# Patient Record
Sex: Male | Born: 1955
Health system: Southern US, Community
[De-identification: ages and names within clinical notes are randomized; demographics above are authoritative.]

## PROBLEM LIST (undated history)

## (undated) DIAGNOSIS — F988 Other specified behavioral and emotional disorders with onset usually occurring in childhood and adolescence: Secondary | ICD-10-CM

## (undated) DIAGNOSIS — M216X1 Other acquired deformities of right foot: Secondary | ICD-10-CM

## (undated) DIAGNOSIS — R339 Retention of urine, unspecified: Secondary | ICD-10-CM

## (undated) DIAGNOSIS — E119 Type 2 diabetes mellitus without complications: Secondary | ICD-10-CM

## (undated) DIAGNOSIS — M47812 Spondylosis without myelopathy or radiculopathy, cervical region: Secondary | ICD-10-CM

## (undated) DIAGNOSIS — I10 Essential (primary) hypertension: Secondary | ICD-10-CM

## (undated) DIAGNOSIS — Z87898 Personal history of other specified conditions: Secondary | ICD-10-CM

## (undated) DIAGNOSIS — R399 Unspecified symptoms and signs involving the genitourinary system: Secondary | ICD-10-CM

## (undated) DIAGNOSIS — Z972 Presence of dental prosthetic device (complete) (partial): Secondary | ICD-10-CM

## (undated) DIAGNOSIS — Z9989 Dependence on other enabling machines and devices: Secondary | ICD-10-CM

## (undated) DIAGNOSIS — M1990 Unspecified osteoarthritis, unspecified site: Secondary | ICD-10-CM

## (undated) DIAGNOSIS — G4733 Obstructive sleep apnea (adult) (pediatric): Secondary | ICD-10-CM

## (undated) DIAGNOSIS — Z973 Presence of spectacles and contact lenses: Secondary | ICD-10-CM

## (undated) DIAGNOSIS — M216X2 Other acquired deformities of left foot: Secondary | ICD-10-CM

## (undated) DIAGNOSIS — N138 Other obstructive and reflux uropathy: Secondary | ICD-10-CM

## (undated) DIAGNOSIS — T4145XA Adverse effect of unspecified anesthetic, initial encounter: Secondary | ICD-10-CM

## (undated) DIAGNOSIS — T8859XA Other complications of anesthesia, initial encounter: Secondary | ICD-10-CM

## (undated) DIAGNOSIS — Z974 Presence of external hearing-aid: Secondary | ICD-10-CM

## (undated) DIAGNOSIS — K08109 Complete loss of teeth, unspecified cause, unspecified class: Secondary | ICD-10-CM

## (undated) DIAGNOSIS — N401 Enlarged prostate with lower urinary tract symptoms: Secondary | ICD-10-CM

## (undated) HISTORY — PX: ANTERIOR CERVICAL DECOMP/DISCECTOMY FUSION: SHX1161

## (undated) HISTORY — PX: DIRECT LARYNGOSCOPY: SHX5326

---

## 2005-11-13 ENCOUNTER — Encounter (INDEPENDENT_AMBULATORY_CARE_PROVIDER_SITE_OTHER): Payer: Self-pay | Admitting: *Deleted

## 2005-11-13 ENCOUNTER — Ambulatory Visit (HOSPITAL_COMMUNITY): Admission: RE | Admit: 2005-11-13 | Discharge: 2005-11-13 | Payer: Self-pay | Admitting: Otolaryngology

## 2006-10-21 ENCOUNTER — Ambulatory Visit: Payer: Self-pay | Admitting: Unknown Physician Specialty

## 2008-03-16 DIAGNOSIS — G4730 Sleep apnea, unspecified: Secondary | ICD-10-CM

## 2008-03-16 HISTORY — DX: Sleep apnea, unspecified: G47.30

## 2008-11-13 ENCOUNTER — Ambulatory Visit: Payer: Self-pay | Admitting: Unknown Physician Specialty

## 2010-01-08 ENCOUNTER — Encounter: Admission: RE | Admit: 2010-01-08 | Discharge: 2010-01-08 | Payer: Self-pay | Admitting: Family Medicine

## 2010-06-23 ENCOUNTER — Encounter (HOSPITAL_COMMUNITY)
Admission: RE | Admit: 2010-06-23 | Discharge: 2010-06-23 | Disposition: A | Discharge status: Home / Self Care | Payer: BC Managed Care – PPO | Source: Ambulatory Visit | Attending: Neurological Surgery | Admitting: Neurological Surgery

## 2010-06-23 ENCOUNTER — Other Ambulatory Visit (HOSPITAL_COMMUNITY): Payer: Self-pay | Admitting: Neurological Surgery

## 2010-06-23 DIAGNOSIS — M4802 Spinal stenosis, cervical region: Secondary | ICD-10-CM

## 2010-06-23 LAB — DIFFERENTIAL
Basophils Relative: 1 % (ref 0–1)
Lymphocytes Relative: 35 % (ref 12–46)
Lymphs Abs: 2.8 10*3/uL (ref 0.7–4.0)
Monocytes Relative: 6 % (ref 3–12)
Neutro Abs: 4.5 10*3/uL (ref 1.7–7.7)
Neutrophils Relative %: 57 % (ref 43–77)

## 2010-06-23 LAB — BASIC METABOLIC PANEL
CO2: 30 mEq/L (ref 19–32)
Chloride: 108 mEq/L (ref 96–112)
Creatinine, Ser: 1.34 mg/dL (ref 0.4–1.5)
GFR calc Af Amer: >60 mL/min (ref >60)

## 2010-06-23 LAB — PROTIME-INR: Prothrombin Time: 12.4 seconds (ref 11.6–15.2)

## 2010-06-23 LAB — CBC
Hemoglobin: 12.9 g/dL — ABNORMAL LOW (ref 13.0–17.0)
MCH: 32.6 pg (ref 26.0–34.0)
RBC: 3.96 MIL/uL — ABNORMAL LOW (ref 4.22–5.81)

## 2010-06-23 LAB — SURGICAL PCR SCREEN: MRSA, PCR: NEGATIVE

## 2010-06-27 ENCOUNTER — Ambulatory Visit (HOSPITAL_COMMUNITY)
Admission: RE | Admit: 2010-06-27 | Discharge: 2010-06-29 | Disposition: A | Discharge status: Home / Self Care | Payer: BC Managed Care – PPO | Source: Ambulatory Visit | Attending: Neurological Surgery | Admitting: Neurological Surgery

## 2010-06-27 ENCOUNTER — Inpatient Hospital Stay (HOSPITAL_COMMUNITY): Payer: BC Managed Care – PPO

## 2010-06-27 DIAGNOSIS — E669 Obesity, unspecified: Secondary | ICD-10-CM | POA: Insufficient documentation

## 2010-06-27 DIAGNOSIS — Z01810 Encounter for preprocedural cardiovascular examination: Secondary | ICD-10-CM | POA: Insufficient documentation

## 2010-06-27 DIAGNOSIS — Z87891 Personal history of nicotine dependence: Secondary | ICD-10-CM | POA: Insufficient documentation

## 2010-06-27 DIAGNOSIS — M4802 Spinal stenosis, cervical region: Secondary | ICD-10-CM | POA: Insufficient documentation

## 2010-06-27 DIAGNOSIS — G4733 Obstructive sleep apnea (adult) (pediatric): Secondary | ICD-10-CM | POA: Insufficient documentation

## 2010-06-27 DIAGNOSIS — I10 Essential (primary) hypertension: Secondary | ICD-10-CM | POA: Insufficient documentation

## 2010-06-27 DIAGNOSIS — J449 Chronic obstructive pulmonary disease, unspecified: Secondary | ICD-10-CM | POA: Insufficient documentation

## 2010-06-27 DIAGNOSIS — K219 Gastro-esophageal reflux disease without esophagitis: Secondary | ICD-10-CM | POA: Insufficient documentation

## 2010-06-27 DIAGNOSIS — Z01818 Encounter for other preprocedural examination: Secondary | ICD-10-CM | POA: Insufficient documentation

## 2010-06-27 DIAGNOSIS — Z01812 Encounter for preprocedural laboratory examination: Secondary | ICD-10-CM | POA: Insufficient documentation

## 2010-06-27 DIAGNOSIS — J4489 Other specified chronic obstructive pulmonary disease: Secondary | ICD-10-CM | POA: Insufficient documentation

## 2010-06-27 DIAGNOSIS — E119 Type 2 diabetes mellitus without complications: Secondary | ICD-10-CM | POA: Insufficient documentation

## 2010-06-27 DIAGNOSIS — G959 Disease of spinal cord, unspecified: Secondary | ICD-10-CM | POA: Insufficient documentation

## 2010-06-27 LAB — GLUCOSE, CAPILLARY
Glucose-Capillary: 137 mg/dL — ABNORMAL HIGH (ref 70–99)
Glucose-Capillary: 222 mg/dL — ABNORMAL HIGH (ref 70–99)

## 2010-06-28 LAB — GLUCOSE, CAPILLARY
Glucose-Capillary: 206 mg/dL — ABNORMAL HIGH (ref 70–99)
Glucose-Capillary: 155 mg/dL — ABNORMAL HIGH (ref 70–99)

## 2010-06-29 LAB — GLUCOSE, CAPILLARY: Glucose-Capillary: 165 mg/dL — ABNORMAL HIGH (ref 70–99)

## 2010-06-30 LAB — GLUCOSE, CAPILLARY: Glucose-Capillary: 166 mg/dL — ABNORMAL HIGH (ref 70–99)

## 2010-06-30 NOTE — Op Note (Signed)
NAME:  Colton Hampton, Colton Hampton NO.:  [id number]  MEDICAL RECORD NO.:  [id number]           PATIENT TYPE:  I  LOCATION:  3528                         FACILITY:  [facility]  PHYSICIAN:  Tia Alert, MD     DATE OF BIRTH:  03-Mar-1956  DATE OF PROCEDURE:  06/27/2010 DATE OF DISCHARGE:                              OPERATIVE REPORT   PREOPERATIVE DIAGNOSES: 1. Cervical spinal stenosis at C3-4 and C6-7 with cord compression. 2. Neck and left arm pain.  POSTOPERATIVE DIAGNOSES: 1. Cervical spinal stenosis at C3-4 and C6-7 with cord compression. 2. Neck and left arm pain. 3. 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  PROCEDURES: 1. Decompressive anterior cervical diskectomy at C3-4 and C6-7. 2. Anterior cervical arthrodesis at C3-4 and C6-7 utilizing PEEK     interbody cages, packed with local autograft and Actifuse putty. 3. Anterior cervical plating at C3-4 and C6-7 utilizing the DePuy     spine plate.  SURGEON:  Tia Alert, MD  ASSISTANT:  Reinaldo Meeker, MD  ANESTHESIA:  General endotracheal.  COMPLICATIONS:  None apparent.  INDICATIONS FOR PROCEDURE:  [name] is a 55 year old gentleman who presented with severe neck pain with radiation into the left arm.  He had an MRI which showed severe spinal stenosis at C3-4 and moderate spinal stenosis at C6-7 with foraminal stenosis at C6-7.  He had neck and left arm pain.  He tried medical management for quite some time without significant relief.  I recommended 2-level ACDF plating at C3-4, and C6-7 through separate incisions.  He understood the risks, benefits, and affected outcomes and wished to proceed.  DESCRIPTION OF PROCEDURE:  The patient was taken to the operating room and after induction of adequate generalized endotracheal anesthesia he was placed in supine position on the operating table.  His right anterior cervical region was prepped with  DuraPrep and draped in the usual sterile fashion.  I localized onto incisions with lateral fluoroscopy.  I started at C3-4 and made a transverse incision and carried this down to the platysma which was elevated, opened, and undermined with Metzenbaum scissors.  I then dissected a plane medial to the sternocleidomastoid muscle and internal carotid artery and lateral to the trachea and esophagus to expose C3-4.  Intraoperative fluoroscopy confirmed my level and the longus colli muscle was taken down and the shadow line retractor was placed under this to expose C3-4.  The annulus was incised and initial diskectomy was done with pituitary rongeurs and curved curettes.  I then used the high-speed drill to drill the endplates down to the level of the posterior longitudinal ligament and posterior spurs.  I save the drill shavings in a mucus trap for later arthrodesis.  I widened the disk space in a rectangular fashion in order to decompress the canal adequately.  I was able to find an ossified ligament at C3 forward with significant compression of the dura.  There were actually 2 components, so this one might be more calcified disk and one more calcified ligament.  I took these off separately.  I was able  to use a nerve hook and gently dissect between the dura and the ossified ligament and then removed it with 1 and 2 mm Kerrison punch pulling up at all times in order to not push down against the cord itself.  I was able to undercut C3 and C4.  Dr. Gerlene Fee helped dissect between the ligament and the osteophyte.  I was able to remove this almost totally on the patient's left-hand side.  We left a very thin shell, but this was released and was no longer compressive of the dura, but there appeared to be thin dura underneath this and we did not want to risk CSF leak when it was no longer causing any compression, so we left a thin shell of ossified ligament on the left-hand side of the canal,  probably in the lateral 25% of the canal.  This was not compressed.  The dura was now full and capacious.  I could pass the nerve hook easily.  I felt like I had an adequate decompression of C3-4.  I measured the interspace to be 8 mm and I used an 8 mm PEEK interbody cage, packed this with local autograft and Actifuse putty and tapped this into position at C3- 4.  I then used a size 14 plate at this level with 14-mm variable angle screws in the bodies of C3 and C4 and then locked these into position. I then irrigated with saline solution, dried all bleeding points, and moved to C6-7 where again we made a transverse incision, carried this down to the platysma which was elevated, opened, and undermined.  I then did the dissection as we did at C3-4 to expose C6-7.  Intraoperative fluoroscopy confirmed my level and then longus colli muscles were taken down and the shadow line retractor once again placed.  The annulus was incised.  The diskectomy was done with pituitary rongeurs and curved curettes.  I then used the high-speed drill once again saving the drill shavings in a mucus trap.  I was able to bring in the operating microscope and opened the posterior longitudinal ligament.  It was not ossified at this level.  I was able to undercut the bodies of C6 and C7 and performed generous foraminotomies on the left more than the right. Since he had left arm pain, I identified the C7 nerve root and marched distally into the foramen until I was distal to the pedicle and had it well decompressed.  I could then pass a nerve hook easily circumferentially especially along the C7 nerve root on the left.  I then irrigated, dried the surgical bed, measured the interspace to be 8 mm.  Once again, I used an 8-mm PEEK interbody cage, packed with local autograft and Actifuse putty, and tapped this into position at C6-7.  I then used a size 14 plate once again and placed 14-mm variable angle screws in bodies  of C6 and C7 and then locked these once again.  I then irrigated it with saline solution containing bacitracin, dried all bleeding points with Surgifoam and bipolar cautery.  Since I had two separate incisions and one was at C3-4, I wanted to place a #7 flat JP drain and this was placed through the C6-7 incision, but extended up to the C3-4 incision to drain both.  I did achieve good hemostasis at both levels and therefore I was able to closed the platysma at both levels with 3-0 Vicryl, the subcuticular tissue with 3-0 Vicryl, and skin with benzoin and Steri-Strips.  The drapes were removed.  Sterile dressing was applied.  The patient was awakened from anesthesia and transferred to the recovery room in stable condition.  At the end of the procedure, all sponge, needle, and instrument counts were correct.     Tia Alert, MD     DSJ/MEDQ  D:  06/27/2010  T:  06/28/2010  Job:  [id number]  Electronically Signed by Marikay Alar MD on 06/29/2010 12:58:45 PM

## 2010-07-14 NOTE — Discharge Summary (Signed)
  NAME:  Colton Hampton, Colton Hampton NO.:  [id number]  MEDICAL RECORD NO.:  [id number]           PATIENT TYPE:  I  LOCATION:  3031                         FACILITY:  [facility]  PHYSICIAN:  Tia Alert, MD     DATE OF BIRTH:  June 07, 1955  DATE OF ADMISSION:  06/27/2010 DATE OF DISCHARGE:  06/29/2010                              DISCHARGE SUMMARY   ADMITTING DIAGNOSIS:  Cervical spondylosis with stenosis.  PROCEDURES:  Anterior cervical diskectomy and fusion with plating at C3- 4 and C6-7.  BRIEF HISTORY OF PRESENT ILLNESS:  Mr. Gentles is a very pleasant 55- year-old gentleman who presented with neck and left arm pain.  He had an MRI which showed severe spinal stenosis at C3-4 and C6-7, recommended two-level ACDF with plating at C3-4 and C6-7.  He understood the risks, benefits, and expected outcome and wished to proceed.  HOSPITAL COURSE:  The patient was admitted on June 27, 2010, taken to the operating room where he underwent a two-level ACDF with plating at C3-4 and C6-7.  The patient did very well following the surgery he had improvement in his left arm pain.  However, he did have urinary retention and this kept him in the hospital for couple of days.  We put him on some Flomax.  We put a catheter in for 24 hours, when it was removed he was able to urinate, had low postvoid residuals, and was discharged to home in stable condition on June 29, 2010. Plans to follow up in 2 weeks, he is going to follow up with his urologist 1 week after discharge.  He was given Flomax upon discharge.  He had normal postoperative neck soreness after surgery, no radiculopathy, had good strength, he was ambulating normally and tolerating regular diet. Discharged to home in stable condition on June 29, 2010.  FINAL DIAGNOSIS:  Anterior cervical diskectomy and fusion with plating C3-4 and C6-7.     Tia Alert, MD     DSJ/MEDQ  D:  06/29/2010  T:  06/29/2010  Job:   [id number]  Electronically Signed by Marikay Alar MD on 07/14/2010 10:46:08 AM

## 2010-08-01 NOTE — Op Note (Signed)
NAME:  KHARTER, BREW NO.:  [id number]   MEDICAL RECORD NO.:  [id number]          PATIENT TYPE:  AMB   LOCATION:  SDS                          FACILITY:  [facility]   PHYSICIAN:  Antony Contras, MD     DATE OF BIRTH:  Nov 30, 1955   DATE OF PROCEDURE:  11/13/2005  DATE OF DISCHARGE:                                 OPERATIVE REPORT   PREOPERATIVE DIAGNOSIS:  1. Right vocal cord lesion.  2. Hoarseness.   POSTOPERATIVE DIAGNOSIS:  1. Right vocal cord lesion.  2. Hoarseness.   PROCEDURE:  1. Suspended micro direct laryngoscopy with right vocal cord stripping for      biopsy.  2. Rigid esophagoscopy.  3. Rigid bronchoscopy.   SURGEON:  Excell Seltzer. Jenne Pane M.D.   ANESTHESIA:  General endotracheal anesthesia.   COMPLICATIONS:  None.   INDICATIONS:  The patient is a 55 year old white male who has had a scratchy  voice for a long time.  He smokes cigarettes and drinks a fair amount of  alcohol.  He was found on examination to have a white lesion of the right  vocal fold and presents to the operating room for biopsy.   FINDINGS:  The right mid vocal fold mucosa is thickened and whitened  consistent with leukoplakia.  There is no gross evidence of invasive disease  of any kind.  A portion of the right tongue base bled during evaluation with  the laryngoscope and so a biopsy was performed, as well, from that area with  the cup forceps.  The bronchoscopy and esophagoscopy showed no abnormality.   DESCRIPTION OF PROCEDURE:  The patient was identified in the holding room,  informed consent having been obtained, the patient was placed on the  operating table in a supine position.  Anesthesia was induced.  The patient  was maintained via mask ventilation.  The bed was turned 90 degrees from  anesthesia and the eyes taped closed.  A damp gauze was placed over the  upper gum.  The larynx was then exposed using a #3 Miller laryngoscope and a  large 0 degree telescope was then  passed through the larynx and into the  trachea to view the trachea and primary bronchi.  There was mucus seen in  the distal mainstem bronchus obstructing the view of the secondary bronchi.  The right side appeared normal.  The telescope was then backed out and a 6  laser safe tube was then placed to the larynx without difficulty.  The  laryngoscope was removed and the tube was positioned on the left side.  The  cuff was inflated with saline and the anesthesia circuit hooked up and the  patient was successfully ventilated.  The tube was taped to the left side.  At this point, a #8 rigid esophagoscope was passed through the mouth to view  the esophagus keeping the lumen in view during placement.  There was  carefully backed out evaluating esophagus 360 degrees coming out.  It was  then removed.  At this point, the pharynx and larynx were examined with the  Dedo laryngoscope evaluating the vallecula, piriform  sinuses, postcricoid  region, and endolarynx.  An area of the tongue base bled during evaluation  and a biopsy was performed in this area.  The endolarynx was then exposed  using the Dedo laryngoscope and was then placed in suspension using the  Louie arm on the Mayo stand.  The microscope was brought into view after  pictures were taken with the 0 degrees telescope.  The larynx was then  carefully evaluated.  The left vocal fold was found to be edematous and  erythematous.  The right vocal fold had the findings noted above.  An  epinephrine soaked pledget was held against the right vocal fold for a  couple of minutes.  This was then removed.  An incision was then made in the  mucosa lateral to the lesion using a sickle knife.  The mucosa was then  grasped with cup forceps and scissors were then used to carefully remove the  mucosa.  It was removed in a piecemeal fashion.  After this was completed,  the vocal fold was again rubbed with an epinephrine soaked pledget.  The  larynx was  then suctioned and the laryngoscope was then taken out of  suspension and removed from the patient's mouth, suctioning out the saliva  and clots.  The patient was then turned back to anesthesia for wake-up and  was extubated and left the room in stable condition.      Antony Contras, MD  Electronically Signed     DDB/MEDQ  D:  11/13/2005  T:  11/13/2005  Job:  [id number]

## 2010-08-04 ENCOUNTER — Other Ambulatory Visit: Payer: Self-pay | Admitting: Neurological Surgery

## 2010-08-04 ENCOUNTER — Ambulatory Visit
Admission: RE | Admit: 2010-08-04 | Discharge: 2010-08-04 | Disposition: A | Discharge status: Home / Self Care | Payer: BC Managed Care – PPO | Source: Ambulatory Visit | Attending: Neurological Surgery | Admitting: Neurological Surgery

## 2010-08-04 DIAGNOSIS — M79609 Pain in unspecified limb: Secondary | ICD-10-CM

## 2010-08-04 DIAGNOSIS — M5412 Radiculopathy, cervical region: Secondary | ICD-10-CM

## 2010-08-04 DIAGNOSIS — M4802 Spinal stenosis, cervical region: Secondary | ICD-10-CM

## 2010-08-04 DIAGNOSIS — M5020 Other cervical disc displacement, unspecified cervical region: Secondary | ICD-10-CM

## 2010-10-06 ENCOUNTER — Other Ambulatory Visit: Payer: Self-pay | Admitting: Neurological Surgery

## 2010-10-06 ENCOUNTER — Ambulatory Visit
Admission: RE | Admit: 2010-10-06 | Discharge: 2010-10-06 | Disposition: A | Discharge status: Home / Self Care | Payer: BC Managed Care – PPO | Source: Ambulatory Visit | Attending: Neurological Surgery | Admitting: Neurological Surgery

## 2010-10-06 DIAGNOSIS — M5020 Other cervical disc displacement, unspecified cervical region: Secondary | ICD-10-CM

## 2010-10-06 DIAGNOSIS — M79609 Pain in unspecified limb: Secondary | ICD-10-CM

## 2010-10-06 DIAGNOSIS — M4802 Spinal stenosis, cervical region: Secondary | ICD-10-CM

## 2010-10-06 DIAGNOSIS — M5412 Radiculopathy, cervical region: Secondary | ICD-10-CM

## 2010-11-24 ENCOUNTER — Ambulatory Visit: Payer: Self-pay | Admitting: Unknown Physician Specialty

## 2010-11-25 LAB — PATHOLOGY REPORT

## 2011-01-05 ENCOUNTER — Ambulatory Visit
Admission: RE | Admit: 2011-01-05 | Discharge: 2011-01-05 | Disposition: A | Discharge status: Home / Self Care | Payer: BC Managed Care – PPO | Source: Ambulatory Visit | Attending: Neurological Surgery | Admitting: Neurological Surgery

## 2011-01-05 ENCOUNTER — Other Ambulatory Visit: Payer: Self-pay | Admitting: Neurological Surgery

## 2011-01-05 DIAGNOSIS — M4802 Spinal stenosis, cervical region: Secondary | ICD-10-CM

## 2011-01-05 DIAGNOSIS — M5020 Other cervical disc displacement, unspecified cervical region: Secondary | ICD-10-CM

## 2011-01-05 DIAGNOSIS — M5412 Radiculopathy, cervical region: Secondary | ICD-10-CM

## 2013-01-02 ENCOUNTER — Ambulatory Visit: Payer: Self-pay | Admitting: Unknown Physician Specialty

## 2013-01-03 LAB — PATHOLOGY REPORT

## 2013-03-02 ENCOUNTER — Ambulatory Visit: Payer: BC Managed Care – PPO | Admitting: Cardiology

## 2013-05-24 ENCOUNTER — Other Ambulatory Visit: Payer: Self-pay | Admitting: Neurological Surgery

## 2013-05-24 DIAGNOSIS — M542 Cervicalgia: Secondary | ICD-10-CM

## 2013-05-31 ENCOUNTER — Ambulatory Visit
Admission: RE | Admit: 2013-05-31 | Discharge: 2013-05-31 | Disposition: A | Discharge status: Home / Self Care | Payer: BC Managed Care – PPO | Source: Ambulatory Visit | Attending: Neurological Surgery | Admitting: Neurological Surgery

## 2013-05-31 DIAGNOSIS — M542 Cervicalgia: Secondary | ICD-10-CM

## 2013-06-07 ENCOUNTER — Other Ambulatory Visit: Payer: Self-pay | Admitting: Neurological Surgery

## 2013-06-07 DIAGNOSIS — M47812 Spondylosis without myelopathy or radiculopathy, cervical region: Secondary | ICD-10-CM

## 2013-06-08 ENCOUNTER — Ambulatory Visit
Admission: RE | Admit: 2013-06-08 | Discharge: 2013-06-08 | Disposition: A | Discharge status: Home / Self Care | Payer: BC Managed Care – PPO | Source: Ambulatory Visit | Attending: Neurological Surgery | Admitting: Neurological Surgery

## 2013-06-08 DIAGNOSIS — M5020 Other cervical disc displacement, unspecified cervical region: Secondary | ICD-10-CM

## 2013-06-08 DIAGNOSIS — M47812 Spondylosis without myelopathy or radiculopathy, cervical region: Secondary | ICD-10-CM

## 2013-06-08 MED ORDER — TRIAMCINOLONE ACETONIDE 40 MG/ML IJ SUSP (RADIOLOGY)
60.0000 mg | Freq: Once | INTRAMUSCULAR | Status: AC
  Administered 2013-06-08: 60 mg via EPIDURAL

## 2013-06-08 MED ORDER — IOHEXOL 300 MG/ML  SOLN
1.0000 mL | Freq: Once | INTRAMUSCULAR | Status: AC | PRN
Start: 2013-06-08 — End: 2013-06-08
  Administered 2013-06-08: 1 mL via INTRAVENOUS

## 2013-06-08 NOTE — Discharge Instructions (Signed)

## 2013-07-03 ENCOUNTER — Other Ambulatory Visit: Payer: Self-pay | Admitting: Neurological Surgery

## 2013-07-19 ENCOUNTER — Encounter (HOSPITAL_COMMUNITY)
Admission: RE | Admit: 2013-07-19 | Discharge: 2013-07-19 | Disposition: A | Discharge status: Home / Self Care | Payer: BC Managed Care – PPO | Source: Ambulatory Visit | Attending: Neurological Surgery | Admitting: Neurological Surgery

## 2013-07-19 ENCOUNTER — Encounter (HOSPITAL_COMMUNITY): Payer: Self-pay

## 2013-07-19 DIAGNOSIS — Z01810 Encounter for preprocedural cardiovascular examination: Secondary | ICD-10-CM | POA: Insufficient documentation

## 2013-07-19 DIAGNOSIS — Z01818 Encounter for other preprocedural examination: Secondary | ICD-10-CM | POA: Insufficient documentation

## 2013-07-19 DIAGNOSIS — Z01812 Encounter for preprocedural laboratory examination: Secondary | ICD-10-CM | POA: Insufficient documentation

## 2013-07-19 HISTORY — DX: Other complications of anesthesia, initial encounter: T88.59XA

## 2013-07-19 HISTORY — DX: Essential (primary) hypertension: I10

## 2013-07-19 HISTORY — DX: Adverse effect of unspecified anesthetic, initial encounter: T41.45XA

## 2013-07-19 HISTORY — DX: Unspecified osteoarthritis, unspecified site: M19.90

## 2013-07-19 HISTORY — DX: Other specified behavioral and emotional disorders with onset usually occurring in childhood and adolescence: F98.8

## 2013-07-19 LAB — BASIC METABOLIC PANEL
BUN: 30 mg/dL — ABNORMAL HIGH (ref 6–23)
CHLORIDE: 106 meq/L (ref 96–112)
CO2: 21 meq/L (ref 19–32)
CREATININE: 1.68 mg/dL — AB (ref 0.50–1.35)
Calcium: 9.4 mg/dL (ref 8.4–10.5)
GFR calc non Af Amer: 43 mL/min — ABNORMAL LOW (ref >90)
GFR, EST AFRICAN AMERICAN: 50 mL/min — AB (ref >90)
GLUCOSE: 90 mg/dL (ref 70–99)
POTASSIUM: 5.2 meq/L (ref 3.7–5.3)
Sodium: 143 mEq/L (ref 137–147)

## 2013-07-19 LAB — CBC
HEMATOCRIT: 36.3 % — AB (ref 39.0–52.0)
HEMOGLOBIN: 12.1 g/dL — AB (ref 13.0–17.0)
MCH: 31.9 pg (ref 26.0–34.0)
MCHC: 33.3 g/dL (ref 30.0–36.0)
MCV: 95.8 fL (ref 78.0–100.0)
Platelets: 171 10*3/uL (ref 150–400)
RBC: 3.79 MIL/uL — ABNORMAL LOW (ref 4.22–5.81)
RDW: 13.5 % (ref 11.5–15.5)
WBC: 7 10*3/uL (ref 4.0–10.5)

## 2013-07-19 LAB — SURGICAL PCR SCREEN
MRSA, PCR: NEGATIVE
Staphylococcus aureus: POSITIVE — AB

## 2013-07-19 NOTE — Progress Notes (Signed)
Pt. Uses [facility] for PCP, denies having to see cardiologist or ever having a stress test.

## 2013-07-19 NOTE — Progress Notes (Signed)
Pt. Requesting that he use his own meds from home for administering while in hosp.  Pt. Informed that he needs to f/u with Dr. Ronnald Ramp with this request & then meds would have to be reviewed & package by our pharmacy

## 2013-07-19 NOTE — Pre-Procedure Instructions (Signed)
Colton Hampton  07/19/2013   Your procedure is scheduled on:  07/26/2013  Report to [facility] Admitting   ENTRANCE A  at 7:15 AM.  Call this number if you have problems the morning of surgery: [phone]   Remember:   Do not eat food or drink liquids after midnight.   Take these medicines the morning of surgery with A SIP OF WATER: nothing    Do not wear jewelry     Do not wear lotions, powders, or perfumes. You may wear deodorant.    Men may shave face and neck.   Do not bring valuables to the hospital.  [facility] is not responsible                  for any belongings or valuables.               Contacts, dentures or bridgework may not be worn into surgery.   Leave suitcase in the car. After surgery it may be brought to your room.   For patients admitted to the hospital, discharge time is determined by your                treatment team.               Patients discharged the day of surgery will not be allowed to drive  home.  Name and phone number of your driver: with family  Special Instructions: Special Instructions: [place] - Preparing for Surgery  Before surgery, you can play an important role.  Because skin is not sterile, your skin needs to be as free of germs as possible.  You can reduce the number of germs on you skin by washing with CHG (chlorahexidine gluconate) soap before surgery.  CHG is an antiseptic cleaner which kills germs and bonds with the skin to continue killing germs even after washing.  Please DO NOT use if you have an allergy to CHG or antibacterial soaps.  If your skin becomes reddened/irritated stop using the CHG and inform your nurse when you arrive at Short Stay.  Do not shave (including legs and underarms) for at least 48 hours prior to the first CHG shower.  You may shave your face.  Please follow these instructions carefully:   1.  Shower with CHG Soap the night before surgery and the  morning of Surgery.  2.  If you  choose to wash your hair, wash your hair first as usual with your  normal shampoo.  3.  After you shampoo, rinse your hair and body thoroughly to remove the  Shampoo.  4.  Use CHG as you would any other liquid soap.  You can apply chg directly to the skin and wash gently with scrungie or a clean washcloth.  5.  Apply the CHG Soap to your body ONLY FROM THE NECK DOWN.    Do not use on open wounds or open sores.  Avoid contact with your eyes, ears, mouth and genitals (private parts).  Wash genitals (private parts)   with your normal soap.  6.  Wash thoroughly, paying special attention to the area where your surgery will be performed.  7.  Thoroughly rinse your body with warm water from the neck down.  8.  DO NOT shower/wash with your normal soap after using and rinsing off   the CHG Soap.  9.  Pat yourself dry with a clean towel.  10.  Wear clean pajamas.            11.  Place clean sheets on your bed the night of your first shower and do not sleep with pets.  Day of Surgery  Do not apply any lotions/deodorants the morning of surgery.  Please wear clean clothes to the hospital/surgery center.   Please read over the following fact sheets that you were given: Pain Booklet, Coughing and Deep Breathing, MRSA Information and Surgical Site Infection Prevention

## 2013-07-19 NOTE — Progress Notes (Signed)
Rx for Mupirocin Ointment called into the [facility] on [address] for a positive PCR of staph. Spoke with pt's wife to notify pt.

## 2013-07-20 NOTE — Progress Notes (Addendum)
Anesthesia Chart Review:  Patient is a 58 year old male scheduled for right C7-T1 foraminotomy on 07/26/13 by Dr. Ronnald Ramp.   History includes former smoker, ADD, DM2, arthritis, OSA with CPAP use, HTN, delayed urination after surgery in 2012, cervical fusion, laryngoscopy.  PCP is Dr. Hulan Fess.  EKG on 07/19/13 showed NSR.  CXR on 07/19/13 showed: No active cardiopulmonary disease.  Preoperative labs noted. BUN/Cr is 30/1.68. Currently, there are no recent comparison labs, but in 2012 BUN/Cr were 28/1.27.  H/H now 12.1/36.3.  Glucose 90.  I'll request more recent labs from Dr. Eddie Dibbles office for comparison.  Once received, I've asked staff to have me review.  If stable would not pursue additional preoperatively testing. If increased from baseline then could consider ISTAT8 just to ensure no further increase. Anticipate that his renal function can likely be followed post-operatively.  George Hugh [facility] Short Stay Center/Anesthesiology Phone [phone] 07/20/2013 5:47 PM  Addendum: 07/24/2013 10:29 AM I received comparison labs from Dr. Eddie Dibbles office. BUN/Cr were 25/1.00 on 05/17/13.  I spoke with [facility] physician line staff at Dr. Eddie Dibbles office this morning.  Dr. Rex Kras is out of town, so they will have Dr. Kathyrn Lass review for additional recommendations. (Labs faxed to [facility] with confirmation.) Patient was on ibuprofen which is now on hold for surgery. Other meds include metformin and Invokana which may effect his renal function.  For now, I am ordering an ISTAT8 for the day of surgery to re-evaluate his renal function.  I also left a voice mail with Lorriane Shire at Dr. Stevenson Clinch office.

## 2013-07-21 NOTE — Progress Notes (Signed)
Spoke with Levada Dy at Dr Eddie Dibbles office re-faxed request for last office visit and last labs results.

## 2013-07-25 MED ORDER — CEFAZOLIN SODIUM-DEXTROSE 2-3 GM-% IV SOLR
2.0000 g | INTRAVENOUS | Status: AC
  Administered 2013-07-26: 2 g via INTRAVENOUS
  Filled 2013-07-25: qty 50

## 2013-07-26 ENCOUNTER — Ambulatory Visit (HOSPITAL_COMMUNITY): Payer: BC Managed Care – PPO | Admitting: Anesthesiology

## 2013-07-26 ENCOUNTER — Encounter (HOSPITAL_COMMUNITY): Payer: BC Managed Care – PPO | Admitting: Vascular Surgery

## 2013-07-26 ENCOUNTER — Ambulatory Visit (HOSPITAL_COMMUNITY)
Admission: RE | Admit: 2013-07-26 | Discharge: 2013-07-27 | Disposition: A | Discharge status: Home / Self Care | Payer: BC Managed Care – PPO | Source: Ambulatory Visit | Attending: Neurological Surgery | Admitting: Neurological Surgery

## 2013-07-26 ENCOUNTER — Encounter (HOSPITAL_COMMUNITY): Payer: Self-pay | Admitting: *Deleted

## 2013-07-26 ENCOUNTER — Ambulatory Visit (HOSPITAL_COMMUNITY): Payer: BC Managed Care – PPO

## 2013-07-26 ENCOUNTER — Encounter (HOSPITAL_COMMUNITY)
Admission: RE | Disposition: A | Discharge status: Home / Self Care | Payer: Self-pay | Source: Ambulatory Visit | Attending: Neurological Surgery

## 2013-07-26 DIAGNOSIS — G4730 Sleep apnea, unspecified: Secondary | ICD-10-CM | POA: Insufficient documentation

## 2013-07-26 DIAGNOSIS — Z79899 Other long term (current) drug therapy: Secondary | ICD-10-CM | POA: Insufficient documentation

## 2013-07-26 DIAGNOSIS — F988 Other specified behavioral and emotional disorders with onset usually occurring in childhood and adolescence: Secondary | ICD-10-CM | POA: Insufficient documentation

## 2013-07-26 DIAGNOSIS — Z87891 Personal history of nicotine dependence: Secondary | ICD-10-CM | POA: Insufficient documentation

## 2013-07-26 DIAGNOSIS — M5020 Other cervical disc displacement, unspecified cervical region: Secondary | ICD-10-CM | POA: Insufficient documentation

## 2013-07-26 DIAGNOSIS — Z9889 Other specified postprocedural states: Secondary | ICD-10-CM

## 2013-07-26 DIAGNOSIS — Z7982 Long term (current) use of aspirin: Secondary | ICD-10-CM | POA: Insufficient documentation

## 2013-07-26 DIAGNOSIS — I10 Essential (primary) hypertension: Secondary | ICD-10-CM | POA: Insufficient documentation

## 2013-07-26 DIAGNOSIS — M47817 Spondylosis without myelopathy or radiculopathy, lumbosacral region: Secondary | ICD-10-CM | POA: Insufficient documentation

## 2013-07-26 DIAGNOSIS — E119 Type 2 diabetes mellitus without complications: Secondary | ICD-10-CM | POA: Insufficient documentation

## 2013-07-26 HISTORY — PX: POSTERIOR CERVICAL LAMINECTOMY: SHX2248

## 2013-07-26 LAB — GLUCOSE, CAPILLARY
GLUCOSE-CAPILLARY: 237 mg/dL — AB (ref 70–99)
Glucose-Capillary: 127 mg/dL — ABNORMAL HIGH (ref 70–99)
Glucose-Capillary: 164 mg/dL — ABNORMAL HIGH (ref 70–99)
Glucose-Capillary: 201 mg/dL — ABNORMAL HIGH (ref 70–99)

## 2013-07-26 LAB — POCT I-STAT, CHEM 8
BUN: 23 mg/dL (ref 6–23)
CALCIUM ION: 1.25 mmol/L — AB (ref 1.12–1.23)
CHLORIDE: 106 meq/L (ref 96–112)
CREATININE: 1.2 mg/dL (ref 0.50–1.35)
GLUCOSE: 134 mg/dL — AB (ref 70–99)
HCT: 38 % — ABNORMAL LOW (ref 39.0–52.0)
Hemoglobin: 12.9 g/dL — ABNORMAL LOW (ref 13.0–17.0)
Potassium: 4.5 mEq/L (ref 3.7–5.3)
Sodium: 144 mEq/L (ref 137–147)
TCO2: 24 mmol/L (ref 0–100)

## 2013-07-26 SURGERY — POSTERIOR CERVICAL LAMINECTOMY
Anesthesia: General | Site: Neck | Laterality: Right

## 2013-07-26 MED ORDER — DEXAMETHASONE SODIUM PHOSPHATE 4 MG/ML IJ SOLN
INTRAMUSCULAR | Status: AC
  Filled 2013-07-26: qty 2

## 2013-07-26 MED ORDER — BUPIVACAINE HCL (PF) 0.25 % IJ SOLN
INTRAMUSCULAR | Status: DC | PRN
Start: 2013-07-26 — End: 2013-07-26
  Administered 2013-07-26: 3 mL

## 2013-07-26 MED ORDER — OXYCODONE HCL 5 MG PO TABS: 5.0000 mg | ORAL_TABLET | Freq: Once | ORAL | Status: DC | PRN

## 2013-07-26 MED ORDER — PHENYLEPHRINE HCL 10 MG/ML IJ SOLN
10.0000 mg | INTRAVENOUS | Status: DC | PRN
  Administered 2013-07-26: 20 ug/min via INTRAVENOUS

## 2013-07-26 MED ORDER — ALBUMIN HUMAN 5 % IV SOLN
INTRAVENOUS | Status: DC | PRN
Start: 2013-07-26 — End: 2013-07-26
  Administered 2013-07-26: 11:00:00 via INTRAVENOUS

## 2013-07-26 MED ORDER — ONDANSETRON HCL 4 MG/2ML IJ SOLN: 4.0000 mg | INTRAMUSCULAR | Status: DC | PRN

## 2013-07-26 MED ORDER — SODIUM CHLORIDE 0.9 % IJ SOLN: 3.0000 mL | Freq: Two times a day (BID) | INTRAMUSCULAR | Status: DC

## 2013-07-26 MED ORDER — PHENOL 1.4 % MT LIQD
1.0000 | OROMUCOSAL | Status: DC | PRN
  Filled 2013-07-26: qty 177

## 2013-07-26 MED ORDER — DEXTROSE 5 % IV SOLN
500.0000 mg | Freq: Four times a day (QID) | INTRAVENOUS | Status: DC | PRN
  Filled 2013-07-26: qty 5

## 2013-07-26 MED ORDER — HYDROCODONE-ACETAMINOPHEN 10-325 MG PO TABS: 1.0000 | ORAL_TABLET | Freq: Four times a day (QID) | ORAL | Status: DC | PRN

## 2013-07-26 MED ORDER — LACTATED RINGERS IV SOLN
INTRAVENOUS | Status: DC | PRN
  Administered 2013-07-26 (×2): via INTRAVENOUS

## 2013-07-26 MED ORDER — GLYCOPYRROLATE 0.2 MG/ML IJ SOLN
INTRAMUSCULAR | Status: AC
  Filled 2013-07-26: qty 4

## 2013-07-26 MED ORDER — ROCURONIUM BROMIDE 50 MG/5ML IV SOLN
INTRAVENOUS | Status: AC
  Filled 2013-07-26: qty 1

## 2013-07-26 MED ORDER — PHENYLEPHRINE HCL 10 MG/ML IJ SOLN
INTRAMUSCULAR | Status: DC | PRN
  Administered 2013-07-26: 80 ug via INTRAVENOUS
  Administered 2013-07-26: 40 ug via INTRAVENOUS
  Administered 2013-07-26 (×2): 80 ug via INTRAVENOUS
  Administered 2013-07-26: 40 ug via INTRAVENOUS
  Administered 2013-07-26: 80 ug via INTRAVENOUS

## 2013-07-26 MED ORDER — EPHEDRINE SULFATE 50 MG/ML IJ SOLN
INTRAMUSCULAR | Status: AC
  Filled 2013-07-26: qty 1

## 2013-07-26 MED ORDER — THROMBIN 5000 UNITS EX SOLR
CUTANEOUS | Status: DC | PRN
  Administered 2013-07-26: 11:00:00 via TOPICAL

## 2013-07-26 MED ORDER — METHYLPHENIDATE HCL 5 MG PO TABS
5.0000 mg | ORAL_TABLET | Freq: Every day | ORAL | Status: DC
  Filled 2013-07-26: qty 1

## 2013-07-26 MED ORDER — ONDANSETRON HCL 4 MG/2ML IJ SOLN
INTRAMUSCULAR | Status: AC
  Filled 2013-07-26: qty 2

## 2013-07-26 MED ORDER — HYDROMORPHONE HCL PF 1 MG/ML IJ SOLN
0.2500 mg | INTRAMUSCULAR | Status: DC | PRN
  Administered 2013-07-26: 0.5 mg via INTRAVENOUS

## 2013-07-26 MED ORDER — LINAGLIPTIN 5 MG PO TABS
5.0000 mg | ORAL_TABLET | Freq: Every day | ORAL | Status: DC
  Administered 2013-07-26 – 2013-07-27 (×2): 5 mg via ORAL
  Filled 2013-07-26 (×2): qty 1

## 2013-07-26 MED ORDER — METFORMIN HCL 500 MG PO TABS
1000.0000 mg | ORAL_TABLET | Freq: Two times a day (BID) | ORAL | Status: DC
  Administered 2013-07-26 – 2013-07-27 (×3): 1000 mg via ORAL
  Filled 2013-07-26 (×4): qty 2

## 2013-07-26 MED ORDER — CANAGLIFLOZIN 100 MG PO TABS
100.0000 mg | ORAL_TABLET | Freq: Every day | ORAL | Status: DC
  Administered 2013-07-27: 100 mg via ORAL
  Filled 2013-07-26 (×2): qty 1

## 2013-07-26 MED ORDER — MENTHOL 3 MG MT LOZG: 1.0000 | LOZENGE | OROMUCOSAL | Status: DC | PRN

## 2013-07-26 MED ORDER — LIDOCAINE HCL (CARDIAC) 20 MG/ML IV SOLN
INTRAVENOUS | Status: AC
  Filled 2013-07-26: qty 5

## 2013-07-26 MED ORDER — LIDOCAINE HCL (CARDIAC) 20 MG/ML IV SOLN
INTRAVENOUS | Status: DC | PRN
  Administered 2013-07-26: 100 mg via INTRAVENOUS

## 2013-07-26 MED ORDER — CEFAZOLIN SODIUM 1-5 GM-% IV SOLN
1.0000 g | Freq: Three times a day (TID) | INTRAVENOUS | Status: AC
  Administered 2013-07-26 – 2013-07-27 (×2): 1 g via INTRAVENOUS
  Filled 2013-07-26 (×2): qty 50

## 2013-07-26 MED ORDER — TAMSULOSIN HCL 0.4 MG PO CAPS
0.4000 mg | ORAL_CAPSULE | Freq: Every day | ORAL | Status: DC
  Administered 2013-07-26 – 2013-07-27 (×2): 0.4 mg via ORAL
  Filled 2013-07-26 (×2): qty 1

## 2013-07-26 MED ORDER — GLIMEPIRIDE 4 MG PO TABS
4.0000 mg | ORAL_TABLET | Freq: Every day | ORAL | Status: DC
  Administered 2013-07-26 – 2013-07-27 (×2): 4 mg via ORAL
  Filled 2013-07-26 (×2): qty 1

## 2013-07-26 MED ORDER — ACETAMINOPHEN 650 MG RE SUPP: 650.0000 mg | RECTAL | Status: DC | PRN

## 2013-07-26 MED ORDER — ARTIFICIAL TEARS OP OINT
TOPICAL_OINTMENT | OPHTHALMIC | Status: DC | PRN
  Administered 2013-07-26: 1 via OPHTHALMIC

## 2013-07-26 MED ORDER — STERILE WATER FOR INJECTION IJ SOLN
INTRAMUSCULAR | Status: AC
  Filled 2013-07-26: qty 10

## 2013-07-26 MED ORDER — ROCURONIUM BROMIDE 100 MG/10ML IV SOLN
INTRAVENOUS | Status: DC | PRN
  Administered 2013-07-26: 50 mg via INTRAVENOUS

## 2013-07-26 MED ORDER — LIDOCAINE HCL 4 % MT SOLN
OROMUCOSAL | Status: DC | PRN
  Administered 2013-07-26: 4 mL via TOPICAL

## 2013-07-26 MED ORDER — THROMBIN 5000 UNITS EX SOLR
CUTANEOUS | Status: DC | PRN
Start: 2013-07-26 — End: 2013-07-26
  Administered 2013-07-26 (×2): 5000 [IU] via TOPICAL

## 2013-07-26 MED ORDER — HEMOSTATIC AGENTS (NO CHARGE) OPTIME
TOPICAL | Status: DC | PRN
  Administered 2013-07-26: 1 via TOPICAL

## 2013-07-26 MED ORDER — SODIUM CHLORIDE 0.9 % IR SOLN
Status: DC | PRN
  Administered 2013-07-26: 11:00:00

## 2013-07-26 MED ORDER — METHYLPHENIDATE HCL 5 MG PO TABS: 5.0000 mg | ORAL_TABLET | ORAL | Status: DC

## 2013-07-26 MED ORDER — MIDAZOLAM HCL 5 MG/5ML IJ SOLN
INTRAMUSCULAR | Status: DC | PRN
  Administered 2013-07-26: 2 mg via INTRAVENOUS

## 2013-07-26 MED ORDER — PROPOFOL 10 MG/ML IV BOLUS
INTRAVENOUS | Status: AC
  Filled 2013-07-26: qty 20

## 2013-07-26 MED ORDER — METHOCARBAMOL 500 MG PO TABS
500.0000 mg | ORAL_TABLET | Freq: Four times a day (QID) | ORAL | Status: DC | PRN
Start: 2013-07-26 — End: 2017-08-03

## 2013-07-26 MED ORDER — PHENYLEPHRINE 40 MCG/ML (10ML) SYRINGE FOR IV PUSH (FOR BLOOD PRESSURE SUPPORT)
PREFILLED_SYRINGE | INTRAVENOUS | Status: AC
  Filled 2013-07-26: qty 10

## 2013-07-26 MED ORDER — METOCLOPRAMIDE HCL 5 MG/ML IJ SOLN: 10.0000 mg | Freq: Once | INTRAMUSCULAR | Status: DC | PRN

## 2013-07-26 MED ORDER — DEXAMETHASONE 4 MG PO TABS
4.0000 mg | ORAL_TABLET | Freq: Four times a day (QID) | ORAL | Status: DC
  Administered 2013-07-26 – 2013-07-27 (×3): 4 mg via ORAL
  Filled 2013-07-26 (×7): qty 1

## 2013-07-26 MED ORDER — POTASSIUM CHLORIDE IN NACL 20-0.9 MEQ/L-% IV SOLN
INTRAVENOUS | Status: DC
  Filled 2013-07-26 (×4): qty 1000

## 2013-07-26 MED ORDER — METHOCARBAMOL 500 MG PO TABS
500.0000 mg | ORAL_TABLET | Freq: Four times a day (QID) | ORAL | Status: DC | PRN
  Administered 2013-07-26 – 2013-07-27 (×3): 500 mg via ORAL
  Filled 2013-07-26 (×3): qty 1

## 2013-07-26 MED ORDER — METOCLOPRAMIDE HCL 5 MG/ML IJ SOLN
INTRAMUSCULAR | Status: AC
  Filled 2013-07-26: qty 2

## 2013-07-26 MED ORDER — FENTANYL CITRATE 0.05 MG/ML IJ SOLN
INTRAMUSCULAR | Status: AC
  Filled 2013-07-26: qty 5

## 2013-07-26 MED ORDER — VECURONIUM BROMIDE 10 MG IV SOLR
INTRAVENOUS | Status: DC | PRN
  Administered 2013-07-26: 2 mg via INTRAVENOUS
  Administered 2013-07-26: 1 mg via INTRAVENOUS
  Administered 2013-07-26: 2 mg via INTRAVENOUS

## 2013-07-26 MED ORDER — MIDAZOLAM HCL 2 MG/2ML IJ SOLN
INTRAMUSCULAR | Status: AC
  Filled 2013-07-26: qty 2

## 2013-07-26 MED ORDER — VECURONIUM BROMIDE 10 MG IV SOLR
INTRAVENOUS | Status: AC
  Filled 2013-07-26: qty 10

## 2013-07-26 MED ORDER — FENTANYL CITRATE 0.05 MG/ML IJ SOLN
INTRAMUSCULAR | Status: DC | PRN
  Administered 2013-07-26: 100 ug via INTRAVENOUS
  Administered 2013-07-26: 50 ug via INTRAVENOUS
  Administered 2013-07-26: 100 ug via INTRAVENOUS

## 2013-07-26 MED ORDER — GLYCOPYRROLATE 0.2 MG/ML IJ SOLN
INTRAMUSCULAR | Status: DC | PRN
  Administered 2013-07-26: .8 mg via INTRAVENOUS

## 2013-07-26 MED ORDER — PROPOFOL 10 MG/ML IV BOLUS
INTRAVENOUS | Status: DC | PRN
  Administered 2013-07-26: 200 mg via INTRAVENOUS
  Administered 2013-07-26 (×2): 50 mg via INTRAVENOUS

## 2013-07-26 MED ORDER — ONDANSETRON HCL 4 MG/2ML IJ SOLN
INTRAMUSCULAR | Status: DC | PRN
  Administered 2013-07-26: 4 mg via INTRAVENOUS

## 2013-07-26 MED ORDER — OXYCODONE HCL 5 MG/5ML PO SOLN: 5.0000 mg | Freq: Once | ORAL | Status: DC | PRN

## 2013-07-26 MED ORDER — DEXAMETHASONE SODIUM PHOSPHATE 4 MG/ML IJ SOLN
4.0000 mg | Freq: Four times a day (QID) | INTRAMUSCULAR | Status: DC
  Filled 2013-07-26 (×4): qty 1

## 2013-07-26 MED ORDER — ACETAMINOPHEN 325 MG PO TABS: 650.0000 mg | ORAL_TABLET | ORAL | Status: DC | PRN

## 2013-07-26 MED ORDER — MORPHINE SULFATE 2 MG/ML IJ SOLN: 1.0000 mg | INTRAMUSCULAR | Status: DC | PRN

## 2013-07-26 MED ORDER — METOCLOPRAMIDE HCL 5 MG/ML IJ SOLN
INTRAMUSCULAR | Status: DC | PRN
  Administered 2013-07-26: 10 mg via INTRAVENOUS

## 2013-07-26 MED ORDER — OXYCODONE-ACETAMINOPHEN 5-325 MG PO TABS
1.0000 | ORAL_TABLET | ORAL | Status: DC | PRN
  Administered 2013-07-26 – 2013-07-27 (×2): 1 via ORAL
  Administered 2013-07-27 (×2): 2 via ORAL
  Filled 2013-07-26 (×2): qty 1
  Filled 2013-07-26 (×2): qty 2

## 2013-07-26 MED ORDER — DEXAMETHASONE SODIUM PHOSPHATE 4 MG/ML IJ SOLN
INTRAMUSCULAR | Status: DC | PRN
  Administered 2013-07-26: 8 mg via INTRAVENOUS

## 2013-07-26 MED ORDER — HYDROMORPHONE HCL PF 1 MG/ML IJ SOLN
INTRAMUSCULAR | Status: AC
  Filled 2013-07-26: qty 1

## 2013-07-26 MED ORDER — ARTIFICIAL TEARS OP OINT
TOPICAL_OINTMENT | OPHTHALMIC | Status: AC
  Filled 2013-07-26: qty 3.5

## 2013-07-26 MED ORDER — NEOSTIGMINE METHYLSULFATE 10 MG/10ML IV SOLN
INTRAVENOUS | Status: DC | PRN
  Administered 2013-07-26: 5 mg via INTRAVENOUS

## 2013-07-26 MED ORDER — 0.9 % SODIUM CHLORIDE (POUR BTL) OPTIME
TOPICAL | Status: DC | PRN
  Administered 2013-07-26: 1000 mL

## 2013-07-26 MED ORDER — LACTATED RINGERS IV SOLN
INTRAVENOUS | Status: DC
  Administered 2013-07-26: 08:00:00 via INTRAVENOUS

## 2013-07-26 MED ORDER — SODIUM CHLORIDE 0.9 % IJ SOLN: 3.0000 mL | INTRAMUSCULAR | Status: DC | PRN

## 2013-07-26 SURGICAL SUPPLY — 56 items
APL SKNCLS STERI-STRIP NONHPOA (GAUZE/BANDAGES/DRESSINGS) ×1
BAG DECANTER FOR FLEXI CONT (MISCELLANEOUS) ×2 IMPLANT
BENZOIN TINCTURE PRP APPL 2/3 (GAUZE/BANDAGES/DRESSINGS) ×2 IMPLANT
BLADE 10 SAFETY STRL DISP (BLADE) IMPLANT
BLADE SURG ROTATE 9660 (MISCELLANEOUS) IMPLANT
BUR MATCHSTICK NEURO 3.0 LAGG (BURR) IMPLANT
CANISTER SUCT 3000ML (MISCELLANEOUS) ×2 IMPLANT
CONT SPEC 4OZ CLIKSEAL STRL BL (MISCELLANEOUS) ×4 IMPLANT
DRAPE C-ARM 42X72 X-RAY (DRAPES) ×2 IMPLANT
DRAPE LAPAROTOMY 100X72 PEDS (DRAPES) ×2 IMPLANT
DRAPE MICROSCOPE ZEISS OPMI (DRAPES) ×2 IMPLANT
DRAPE POUCH INSTRU U-SHP 10X18 (DRAPES) ×2 IMPLANT
DRESSING TELFA 8X3 (GAUZE/BANDAGES/DRESSINGS) ×2 IMPLANT
DRSG OPSITE 4X5.5 SM (GAUZE/BANDAGES/DRESSINGS) ×4 IMPLANT
DRSG OPSITE POSTOP 3X4 (GAUZE/BANDAGES/DRESSINGS) ×2 IMPLANT
DURAPREP 26ML APPLICATOR (WOUND CARE) ×2 IMPLANT
ELECT REM PT RETURN 9FT ADLT (ELECTROSURGICAL) ×2
ELECTRODE REM PT RTRN 9FT ADLT (ELECTROSURGICAL) ×1 IMPLANT
GAUZE SPONGE 4X4 16PLY XRAY LF (GAUZE/BANDAGES/DRESSINGS) IMPLANT
GLOVE BIO SURGEON STRL SZ8 (GLOVE) ×2 IMPLANT
GLOVE BIOGEL PI IND STRL 7.0 (GLOVE) IMPLANT
GLOVE BIOGEL PI INDICATOR 7.0 (GLOVE) ×1
GLOVE ECLIPSE 6.5 STRL STRAW (GLOVE) ×2 IMPLANT
GLOVE ECLIPSE 7.5 STRL STRAW (GLOVE) ×1 IMPLANT
GLOVE OPTIFIT SS 6.5 STRL BRWN (GLOVE) ×6 IMPLANT
GOWN BRE IMP SLV AUR LG STRL (GOWN DISPOSABLE) IMPLANT
GOWN BRE IMP SLV AUR XL STRL (GOWN DISPOSABLE) ×2 IMPLANT
GOWN STRL REIN 2XL LVL4 (GOWN DISPOSABLE) IMPLANT
GOWN STRL REUS W/ TWL LRG LVL3 (GOWN DISPOSABLE) IMPLANT
GOWN STRL REUS W/ TWL XL LVL3 (GOWN DISPOSABLE) ×1 IMPLANT
GOWN STRL REUS W/TWL LRG LVL3 (GOWN DISPOSABLE) ×4
GOWN STRL REUS W/TWL XL LVL3 (GOWN DISPOSABLE) ×2
HEMOSTAT POWDER KIT SURGIFOAM (HEMOSTASIS) IMPLANT
KIT BASIN OR (CUSTOM PROCEDURE TRAY) ×2 IMPLANT
KIT ROOM TURNOVER OR (KITS) ×2 IMPLANT
NEEDLE HYPO 22GX1.5 SAFETY (NEEDLE) ×2 IMPLANT
NEEDLE SPNL 20GX3.5 QUINCKE YW (NEEDLE) ×2 IMPLANT
NS IRRIG 1000ML POUR BTL (IV SOLUTION) ×2 IMPLANT
PACK LAMINECTOMY NEURO (CUSTOM PROCEDURE TRAY) ×2 IMPLANT
PAD ARMBOARD 7.5X6 YLW CONV (MISCELLANEOUS) ×2 IMPLANT
PIN MAYFIELD SKULL DISP (PIN) ×2 IMPLANT
RUBBERBAND STERILE (MISCELLANEOUS) ×2 IMPLANT
SPONGE GAUZE 4X4 12PLY (GAUZE/BANDAGES/DRESSINGS) ×2 IMPLANT
SPONGE LAP 4X18 X RAY DECT (DISPOSABLE) IMPLANT
SPONGE SURGIFOAM ABS GEL SZ50 (HEMOSTASIS) ×2 IMPLANT
STRIP CLOSURE SKIN 1/2X4 (GAUZE/BANDAGES/DRESSINGS) ×2 IMPLANT
SUT VIC AB 0 CT1 18XCR BRD8 (SUTURE) ×1 IMPLANT
SUT VIC AB 0 CT1 8-18 (SUTURE) ×2
SUT VIC AB 2-0 CP2 18 (SUTURE) ×2 IMPLANT
SUT VIC AB 3-0 SH 8-18 (SUTURE) ×2 IMPLANT
SWABSTICK BENZOIN STERILE (MISCELLANEOUS) ×2 IMPLANT
SYR 20ML ECCENTRIC (SYRINGE) ×2 IMPLANT
TAPE STRIPS DRAPE STRL (GAUZE/BANDAGES/DRESSINGS) ×1 IMPLANT
TOWEL OR 17X24 6PK STRL BLUE (TOWEL DISPOSABLE) ×2 IMPLANT
TOWEL OR 17X26 10 PK STRL BLUE (TOWEL DISPOSABLE) ×2 IMPLANT
WATER STERILE IRR 1000ML POUR (IV SOLUTION) ×2 IMPLANT

## 2013-07-26 NOTE — Anesthesia Procedure Notes (Signed)
Procedure Name: Intubation Date/Time: 07/26/2013 10:17 AM Performed by: Blair Heys E Pre-anesthesia Checklist: Patient identified, Emergency Drugs available, Suction available and Patient being monitored Patient Re-evaluated:Patient Re-evaluated prior to inductionOxygen Delivery Method: Circle system utilized Preoxygenation: Pre-oxygenation with 100% oxygen Intubation Type: IV induction Ventilation: Mask ventilation without difficulty and Oral airway inserted - appropriate to patient size Grade View: Grade I Tube type: Oral Tube size: 7.5 mm Number of attempts: 1 Airway Equipment and Method: Stylet,  Video-laryngoscopy and Oral airway Placement Confirmation: ETT inserted through vocal cords under direct vision,  positive ETCO2 and breath sounds checked- equal and bilateral Secured at: 23 cm Tube secured with: Tape Dental Injury: Teeth and Oropharynx as per pre-operative assessment

## 2013-07-26 NOTE — Anesthesia Postprocedure Evaluation (Signed)
Anesthesia Post Note  Patient: Colton Hampton  Procedure(s) Performed: Procedure(s) (LRB): Right C7-T1 Foraminotomy (Right)  Anesthesia type: General  Patient location: PACU  Post pain: Pain level controlled  Post assessment: Patient's Cardiovascular Status Stable  Last Vitals:  Filed Vitals:   07/26/13 1230  BP: 124/61  Pulse: 93  Temp:   Resp: 18    Post vital signs: Reviewed and stable  Level of consciousness: alert  Complications: No apparent anesthesia complications

## 2013-07-26 NOTE — Op Note (Signed)
07/26/2013  12:11 PM  PATIENT:  Colton Hampton  58 y.o. male  PRE-OPERATIVE DIAGNOSIS:  Right C7-T1 herniated nucleus pulposus/ foraminal stenosis with right C8 radiculopathy  POST-OPERATIVE DIAGNOSIS:  Same  PROCEDURE:  Right C7-T1 decompressive hemilaminectomy medial facetectomy and foraminotomies followed by microdiscectomy C7-T1 on the right utilizing microdissection  SURGEON:  Sherley Bounds, MD  ASSISTANTS: Dr. Christella Noa  ANESTHESIA:   General  EBL: 300 ml  Total I/O In: 1250 [I.V.:1000; IV Piggyback:250] Out: 300 [Blood:300]  BLOOD ADMINISTERED:none  DRAINS: None   SPECIMEN:  No Specimen  INDICATION FOR PROCEDURE: This patient underwent a previous C3-4 and C6-7 ACDF with plating. He presented with neck and right arm pain with some weakness in his hand and numbness and tingling in the fourth and third digits. MRI showed foraminal stenosis at C7-T1 on the right with disc protrusion within the foramen. I recommended a foraminotomy and discectomy. Patient understood the risks, benefits, and alternatives and potential outcomes and wished to proceed.  PROCEDURE DETAILS: The patient was brought to the operating room. Generalized endotracheal anesthesia was induced. The patient was affixed a 3 point [place] headrest and rolled into the prone position on chest rolls. All pressure points were padded. The posterior cervical region was cleaned and prepped with DuraPrep and then draped in the usual sterile fashion. 7 cc of local anesthesia was injected and a dorsal midline incision made in the posterior cervical region and carried down to the cervical fascia. The fascia was opened and the paraspinous musculature was taken down to expose C7-T1 on the right. Intraoperative fluoroscopy confirmed my level and then used a combination of the high-speed drill and the gold Kerrison punches to perform a right C7-T1 hemilaminectomy, medial facetectomy, and foraminotomies to decompress the right C8  nerve root. C8 nerve root was identified and decompressed distally until I was distal to the pedicle. coagulated the epidural venous vasculature overlying the disc space just inferior to the C8 nerve root within the axilla of the nerve root. There was an obvious disc protrusion at this level. I incised the disc and then was able to remove 3 fragments of disc from the disc space. This relaxed to the C8 nerve root. I palpated with I nerve hook under the nerve root into the foramen and felt no more compression of the nerve. I dried the surgical bed with bipolar cautery and with Surgifoam. I irrigated and Surgifoam away. I irrigated with saline solution containing bacitracin. I lined the dura with Gelfoam. After hemostasis was achieved I closed the muscle and the fascia with 0 Vicryl, subcutaneous tissue with 2-0 Vicryl, and the subcuticular tissue with 3-0 Vicryl. The skin was closed with benzoin and Steri-Strips. A sterile dressing was applied, the patient was turned to the supine position and taken out of the headrest, awakened from general anesthesia and transferred to the recovery room in stable condition. At the end of the procedure all sponge, needle and instrument counts were correct.   PLAN OF CARE: Admit for overnight observation  PATIENT DISPOSITION:  PACU - hemodynamically stable.   Delay start of Pharmacological VTE agent (>24hrs) due to surgical blood loss or risk of bleeding:  yes

## 2013-07-26 NOTE — H&P (Signed)
Subjective:   Patient is a 58 y.o. male admitted for right C7-T1 foraminotomy. The patient first presented to me with complaints of shooting pains in the arm(s). Onset of symptoms was several months ago. The pain is described as aching and occurs all day. The pain is rated severe, and is located  base of the neck and radiates to the right arm and a C8 distribution with associated numbness and tingling and some weakness. The symptoms have been progressive. Symptoms are exacerbated by extending head backwards, and are relieved by none.  Previous work up includes MRI of cervical spine, results: disc bulge at C7-T1 right.  Past Medical History  Diagnosis Date  . Diabetes mellitus without complication   . Complication of anesthesia     delayed urination after surgery - 2012  . Pneumonia 1972    during [place].   . Sleep apnea 2010    CPAP- q night   . ADD (attention deficit disorder)     J. Hughesm follows pt.- [facility]  . Arthritis     cervical spondylosis   . Hypertension     went off Lisinopril approx. 6 weeks ago, due to BP being wnl since starting Invokana     Past Surgical History  Procedure Laterality Date  . Cervical fusion  2012  . Laryngoscopy  2007    No Known Allergies  History  Substance Use Topics  . Smoking status: Former Smoker    Quit date: 07/20/2006  . Smokeless tobacco: Not on file  . Alcohol Use: Yes     Comment: socially     History reviewed. No pertinent family history. Prior to Admission medications   Medication Sig Start Date End Date Taking? Authorizing Provider  acetaminophen (TYLENOL) 325 MG tablet Take 650 mg by mouth every 6 (six) hours as needed. Patient has been taking 3-4 every 4 hours   Yes Historical Provider, MD  Canagliflozin (INVOKANA) 100 MG TABS Take 100 mg by mouth daily before breakfast.   Yes Historical Provider, MD  doxylamine, Sleep, (UNISOM) 25 MG tablet Take 25 mg by mouth at bedtime as needed.   Yes Historical Provider, MD   glimepiride (AMARYL) 4 MG tablet Take 4 mg by mouth daily with supper.   Yes Historical Provider, MD  metFORMIN (GLUCOPHAGE) 1000 MG tablet Take 1,000 mg by mouth 2 (two) times daily with a meal.   Yes Historical Provider, MD  methylphenidate (RITALIN) 10 MG tablet Take 5 mg by mouth See admin instructions. Take 1/2 tablet (5 mg) every morning on work days, may take an additional 1/2 tablet later in the day if needed to focus   Yes Historical Provider, MD  Multiple Vitamin (MULTIVITAMIN WITH MINERALS) TABS tablet Take 1 tablet by mouth daily with supper.   Yes Historical Provider, MD  oxyCODONE-acetaminophen (PERCOCET/ROXICET) 5-325 MG per tablet Take 2 tablets by mouth at bedtime as needed (pain).   Yes Historical Provider, MD  simvastatin (ZOCOR) 40 MG tablet Take 40 mg by mouth daily.   Yes Historical Provider, MD  sitaGLIPtin (JANUVIA) 100 MG tablet Take 100 mg by mouth daily.   Yes Historical Provider, MD  tamsulosin (FLOMAX) 0.4 MG CAPS capsule Take 0.4 mg by mouth daily with supper.   Yes Historical Provider, MD  aspirin EC 81 MG tablet Take 81 mg by mouth daily with supper.    Historical Provider, MD  ibuprofen (ADVIL,MOTRIN) 200 MG tablet Take 800 mg by mouth every 4 (four) hours as needed (pain).  Historical Provider, MD  Omega-3 Fatty Acids (FISH OIL) 1000 MG CAPS Take 1,000 mg by mouth daily with supper.    Historical Provider, MD     Review of Systems  Positive ROS: neg  All other systems have been reviewed and were otherwise negative with the exception of those mentioned in the HPI and as above.  Objective: Vital signs in last 24 hours: Temp:  [98 F (36.7 C)] 98 F (36.7 C) (05/13 0747) Pulse Rate:  [79] 79 (05/13 0747) Resp:  [20] 20 (05/13 0747) BP: (159)/(81) 159/81 mmHg (05/13 0747) SpO2:  [98 %] 98 % (05/13 0747) Weight:  [115.214 kg (254 lb)] 115.214 kg (254 lb) (05/13 0747)  General Appearance: Alert, cooperative, no distress, appears stated age Head:  Normocephalic, without obvious abnormality, atraumatic Eyes: PERRL, conjunctiva/corneas clear, EOM's intact      Neck: Supple, symmetrical, trachea midline, Back: Symmetric, no curvature, ROM normal, no CVA tenderness Lungs:  respirations unlabored Heart: Regular rate and rhythm Abdomen: Soft, non-tender Extremities: Extremities normal, atraumatic, no cyanosis or edema Pulses: 2+ and symmetric all extremities Skin: Skin color, texture, turgor normal, no rashes or lesions  NEUROLOGIC:  Mental status: Alert and oriented x4, no aphasia, good attention span, fund of knowledge and memory  Motor Exam - grossly normal except for some mild weakness of the right hand Sensory Exam - grossly normal Reflexes: 1+ Coordination - grossly normal Gait - grossly normal Balance - grossly normal Cranial Nerves: I: smell Not tested  II: visual acuity  OS: nl    OD: nl  II: visual fields Full to confrontation  II: pupils Equal, round, reactive to light  III,VII: ptosis None  III,IV,VI: extraocular muscles  Full ROM  V: mastication Normal  V: facial light touch sensation  Normal  V,VII: corneal reflex  Present  VII: facial muscle function - upper  Normal  VII: facial muscle function - lower Normal  VIII: hearing Not tested  IX: soft palate elevation  Normal  IX,X: gag reflex Present  XI: trapezius strength  5/5  XI: sternocleidomastoid strength 5/5  XI: neck flexion strength  5/5  XII: tongue strength  Normal    Data Review Lab Results  Component Value Date   WBC 7.0 07/19/2013   HGB 12.9* 07/26/2013   HCT 38.0* 07/26/2013   MCV 95.8 07/19/2013   PLT 171 07/19/2013   Lab Results  Component Value Date   NA 144 07/26/2013   K 4.5 07/26/2013   CL 106 07/26/2013   CO2 21 07/19/2013   BUN 23 07/26/2013   CREATININE 1.20 07/26/2013   GLUCOSE 134* 07/26/2013   Lab Results  Component Value Date   INR 0.90 06/23/2010    Assessment:   Cervical neck pain with herniated nucleus pulposus/ spondylosis/  stenosis at C7-T1. Patient has failed conservative therapy. Planned surgery : C7-T1 and right-sided foraminotomy and possible discectomy  Plan:   I explained the condition and procedure to the patient and answered any questions.  Patient wishes to proceed with procedure as planned. Understands risks/ benefits/ and expected or typical outcomes.  Eustace Moore 07/26/2013 9:49 AM

## 2013-07-26 NOTE — Anesthesia Preprocedure Evaluation (Addendum)
Anesthesia Evaluation  Patient identified by MRN, date of birth, ID band Patient awake    Reviewed: Allergy & Precautions, H&P , NPO status , Patient's Chart, lab work & pertinent test results, reviewed documented beta blocker date and time   History of Anesthesia Complications (+) history of anesthetic complications  Airway Mallampati: II TM Distance: >3 FB Neck ROM: full    Dental  (+) Edentulous Upper, Edentulous Lower, Dental Advisory Given   Pulmonary sleep apnea and Continuous Positive Airway Pressure Ventilation , pneumonia -, resolved, former smoker,  breath sounds clear to auscultation        Cardiovascular hypertension, On Medications Rhythm:regular     Neuro/Psych PSYCHIATRIC DISORDERS negative neurological ROS     GI/Hepatic negative GI ROS, Neg liver ROS,   Endo/Other  diabetes, Type 2, Oral Hypoglycemic Agents  Renal/GU Renal InsufficiencyRenal disease  negative genitourinary   Musculoskeletal   Abdominal   Peds  (+) ATTENTION DEFICIT DISORDER WITHOUT HYPERACTIVITY Hematology negative hematology ROS (+)   Anesthesia Other Findings See surgeon's H&P   Reproductive/Obstetrics negative OB ROS                          Anesthesia Physical Anesthesia Plan  ASA: II  Anesthesia Plan: General   Post-op Pain Management:    Induction: Intravenous  Airway Management Planned: Oral ETT and Video Laryngoscope Planned  Additional Equipment:   Intra-op Plan:   Post-operative Plan: Extubation in OR  Informed Consent: I have reviewed the patients History and Physical, chart, labs and discussed the procedure including the risks, benefits and alternatives for the proposed anesthesia with the patient or authorized representative who has indicated his/her understanding and acceptance.   Dental Advisory Given and Dental advisory given  Plan Discussed with: CRNA and Surgeon  Anesthesia  Plan Comments:        Anesthesia Quick Evaluation

## 2013-07-26 NOTE — Progress Notes (Signed)
Patient noted to be taking more than recommended doses of ibuprofen (600mg  to 800 mg every 4- 6 hours) and tylenol (925 mg to 1300 mg every 4 hours in addition to what is contained in percocet). Patient noted to have stopped ibuprofen as noted on med rec. Patient notified about elevated renal function and that it may be related to inappropriate use of ibuprofen as renal function was normal today with ibuprofen being stopped. Patient educated on importance of contacting MD if pain medication is not working instead of tripling doses of medications. Patient and wife verbalized understanding.

## 2013-07-26 NOTE — Transfer of Care (Signed)
Immediate Anesthesia Transfer of Care Note  Patient: Colton Hampton  Procedure(s) Performed: Procedure(s) with comments: Right C7-T1 Foraminotomy (Right) - Right C7-T1 Foraminotomy  Patient Location: PACU  Anesthesia Type:General  Level of Consciousness: awake and alert   Airway & Oxygen Therapy: Patient Spontanous Breathing and Patient connected to nasal cannula oxygen  Post-op Assessment: Report given to PACU RN, Post -op Vital signs reviewed and stable and Patient moving all extremities X 4  Post vital signs: Reviewed and stable  Complications: No apparent anesthesia complications

## 2013-07-26 NOTE — Discharge Summary (Signed)
Physician Discharge Summary  Patient ID: Colton Hampton MRN: [id number] DOB/AGE: 59-Apr-1957 59 y.o.  Admit date: 07/26/2013 Discharge date: 07/26/2013  Admission Diagnoses: R C7-T1 HNP    Discharge Diagnoses: same   Discharged Condition: good  Hospital Course: The patient was admitted on 07/26/2013 and taken to the operating room where the patient underwent R C7-T1 foraminotomy and diskectomy. The patient tolerated the procedure well and was taken to the recovery room and then to the floor in stable condition. The hospital course was routine. There were no complications. The wound remained clean dry and intact. Pt had appropriate neck soreness. No complaints of arm pain or new N/T/W. The patient remained afebrile with stable vital signs, and tolerated a regular diet. The patient continued to increase activities, and pain was well controlled with oral pain medications.   Consults: None  Significant Diagnostic Studies:  Results for orders placed during the hospital encounter of 07/26/13  GLUCOSE, CAPILLARY      Result Value Ref Range   Glucose-Capillary 127 (*) 70 - 99 mg/dL  GLUCOSE, CAPILLARY      Result Value Ref Range   Glucose-Capillary 164 (*) 70 - 99 mg/dL  POCT I-STAT, CHEM 8      Result Value Ref Range   Sodium 144  137 - 147 mEq/L   Potassium 4.5  3.7 - 5.3 mEq/L   Chloride 106  96 - 112 mEq/L   BUN 23  6 - 23 mg/dL   Creatinine, Ser 1.20  0.50 - 1.35 mg/dL   Glucose, Bld 134 (*) 70 - 99 mg/dL   Calcium, Ion 1.25 (*) 1.12 - 1.23 mmol/L   TCO2 24  0 - 100 mmol/L   Hemoglobin 12.9 (*) 13.0 - 17.0 g/dL   HCT 38.0 (*) 39.0 - 52.0 %    Dg Chest 2 View  07/19/2013   CLINICAL DATA:  preop  EXAM: CHEST  2 VIEW  COMPARISON:  None.  FINDINGS: The heart size and mediastinal contours are within normal limits. Both lungs are clear. The visualized skeletal structures are unremarkable.  IMPRESSION: No active cardiopulmonary disease.   Electronically Signed   By: Margaree Mackintosh  M.D.   On: 07/19/2013 16:22   Dg Cervical Spine 2-3 Views  07/26/2013   CLINICAL DATA:  Posterior cervical fusion, old acdf was done at the level of C6-C7, per. Dr. Jeannene Patella: CERVICAL SPINE - 2-3 VIEW  COMPARISON:  MR CERVICAL SPINE W/O CM dated 06/23/2013  FINDINGS: The patient is status post anterior fusion of C6-7 and C3-4. Skin retractors are appreciated within the posterior base of the neck. A surgical probe tip projects along the inferior border of the C7-T1 facets.  IMPRESSION: Intra op cervical spine a landmark evaluation.   Electronically Signed   By: Margaree Mackintosh M.D.   On: 07/26/2013 11:54   Dg C-arm 1-60 Min  07/26/2013   CLINICAL DATA:  Posterior cervical fusion, old acdf was done at the level of C6-C7, per. Dr. Jeannene Patella: DG C-ARM 1-60 MIN  COMPARISON:  DG EPIDUROGRAM S+I dated 06/08/2013  FINDINGS: The patient has a history of anterior fusion of C6-7. There is also anterior fusion of C3-4 partially visualized. A surgical probe tip projects posterior and inferior to the C7-T1 facet. Skin retractors are appreciated within the posterior base of the neck.  IMPRESSION: Intra op cervical spine landmarks evaluation.   Electronically Signed   By: Margaree Mackintosh M.D.   On: 07/26/2013 11:52  Antibiotics:  Anti-infectives   Start     Dose/Rate Route Frequency Ordered Stop   07/26/13 1800  ceFAZolin (ANCEF) IVPB 1 g/50 mL premix     1 g 100 mL/hr over 30 Minutes Intravenous Every 8 hours 07/26/13 1547 07/27/13 0959   07/26/13 1059  bacitracin 50,000 Units in sodium chloride irrigation 0.9 % 500 mL irrigation  Status:  Discontinued       As needed 07/26/13 1059 07/26/13 1214   07/26/13 0600  ceFAZolin (ANCEF) IVPB 2 g/50 mL premix     2 g 100 mL/hr over 30 Minutes Intravenous On call to O.R. 07/25/13 1421 07/26/13 1018      Discharge Exam: Blood pressure 149/85, pulse 84, temperature 98.6 F (37 C), temperature source Oral, resp. rate 18, height 5' 10.5" (1.791 m), weight 115.214 kg  (254 lb), SpO2 96.00%. Neurologic: Grossly normal Incision CDI  Discharge Medications:     Medication List    STOP taking these medications       oxyCODONE-acetaminophen 5-325 MG per tablet  Commonly known as:  PERCOCET/ROXICET      TAKE these medications       acetaminophen 325 MG tablet  Commonly known as:  TYLENOL  Take 650 mg by mouth every 6 (six) hours as needed. Patient has been taking 3-4 every 4 hours     aspirin EC 81 MG tablet  Take 81 mg by mouth daily with supper.     doxylamine (Sleep) 25 MG tablet  Commonly known as:  UNISOM  Take 25 mg by mouth at bedtime as needed.     Fish Oil 1000 MG Caps  Take 1,000 mg by mouth daily with supper.     glimepiride 4 MG tablet  Commonly known as:  AMARYL  Take 4 mg by mouth daily with supper.     HYDROcodone-acetaminophen 10-325 MG per tablet  Commonly known as:  NORCO  Take 1 tablet by mouth every 6 (six) hours as needed.     ibuprofen 200 MG tablet  Commonly known as:  ADVIL,MOTRIN  Take 800 mg by mouth every 4 (four) hours as needed (pain).     INVOKANA 100 MG Tabs  Generic drug:  Canagliflozin  Take 100 mg by mouth daily before breakfast.     metFORMIN 1000 MG tablet  Commonly known as:  GLUCOPHAGE  Take 1,000 mg by mouth 2 (two) times daily with a meal.     methocarbamol 500 MG tablet  Commonly known as:  ROBAXIN  Take 1 tablet (500 mg total) by mouth every 6 (six) hours as needed for muscle spasms.     methylphenidate 10 MG tablet  Commonly known as:  RITALIN  Take 5 mg by mouth See admin instructions. Take 1/2 tablet (5 mg) every morning on work days, may take an additional 1/2 tablet later in the day if needed to focus     multivitamin with minerals Tabs tablet  Take 1 tablet by mouth daily with supper.     simvastatin 40 MG tablet  Commonly known as:  ZOCOR  Take 40 mg by mouth daily.     sitaGLIPtin 100 MG tablet  Commonly known as:  JANUVIA  Take 100 mg by mouth daily.     tamsulosin 0.4  MG Caps capsule  Commonly known as:  FLOMAX  Take 0.4 mg by mouth daily with supper.        Disposition: home   Final Dx: R C7-T1 foraminotomy and diskectomy  Discharge Orders   Future Orders Complete By Expires   Call MD for:  difficulty breathing, headache or visual disturbances  As directed    Call MD for:  persistant nausea and vomiting  As directed    Call MD for:  redness, tenderness, or signs of infection (pain, swelling, redness, odor or green/yellow discharge around incision site)  As directed    Call MD for:  severe uncontrolled pain  As directed    Call MD for:  temperature >100.4  As directed    Diet - low sodium heart healthy  As directed    Discharge instructions  As directed    Increase activity slowly  As directed    Remove dressing in 48 hours  As directed       Follow-up Information   Follow up with Kaile Bixler S, MD. Schedule an appointment as soon as possible for a visit in 2 weeks.   Specialty:  Neurosurgery   Contact information:   [address] [place] [address] [phone]        Signed: Eustace Moore 07/26/2013, 4:16 PM

## 2013-07-26 NOTE — Plan of Care (Signed)
Problem: Consults Goal: Diagnosis - Spinal Surgery Outcome: Completed/Met Date Met:  07/26/13 Cervical Spine Fusion

## 2013-07-27 ENCOUNTER — Encounter (HOSPITAL_COMMUNITY): Payer: Self-pay | Admitting: Neurological Surgery

## 2013-07-27 LAB — GLUCOSE, CAPILLARY
GLUCOSE-CAPILLARY: 154 mg/dL — AB (ref 70–99)
GLUCOSE-CAPILLARY: 160 mg/dL — AB (ref 70–99)
Glucose-Capillary: 180 mg/dL — ABNORMAL HIGH (ref 70–99)

## 2013-07-27 NOTE — Progress Notes (Signed)
Placed patient on CPAP via nasal mask, 10.0 cm H20 per home settings. Patient tolerating well.

## 2013-07-27 NOTE — Progress Notes (Signed)
Inpatient Diabetes Program Recommendations  AACE/ADA: New Consensus Statement on Inpatient Glycemic Control (2013)  Target Ranges:  Prepandial:   less than 140 mg/dL      Peak postprandial:   less than 180 mg/dL (1-2 hours)      Critically ill patients:  140 - 180 mg/dL   Results for ANSHUL, MEDDINGS (MRN [id number]) as of 07/27/2013 09:28  Ref. Range 07/26/2013 07:50 07/26/2013 12:19 07/26/2013 17:38 07/26/2013 21:51 07/27/2013 08:50  Glucose-Capillary Latest Range: 70-99 mg/dL 127 (H) 164 (H) 237 (H) 201 (H) 180 (H)   Diabetes history: DM2 Outpatient Diabetes medications: Metformin 1000 mg BID, Invokana 100 mg QAM, Amaryl 4 mg daily in evening, Januvia 100 mg QD Current orders for Inpatient glycemic control: Metformin 1000 mg BID, Invokana 100 mg QAM, Amaryl 4 mg daily in evening, Tradjenta 5 mg daily  Inpatient Diabetes Program Recommendations Correction (SSI): While inpatient, please consider ordering CBGs with Novolog correction scale ACHS.  Thanks, Barnie Alderman, RN, MSN, CCRN Diabetes Coordinator Inpatient Diabetes Program [phone] (Team Pager) [phone] ([facility] office) [phone] [facility] office)

## 2013-07-27 NOTE — Progress Notes (Signed)
Patient ID: Colton Hampton, male   DOB: [birth year], 58 y.o.   MRN: [id number] Subjective: Patient reports no arm pain, no weakness. Neck sore as expected  Objective: Vital signs in last 24 hours: Temp:  [97.6 F (36.4 C)-99.3 F (37.4 C)] 98.4 F (36.9 C) (05/14 0755) Pulse Rate:  [68-103] 68 (05/14 0755) Resp:  [12-26] 18 (05/14 0755) BP: (105-167)/(55-99) 134/78 mmHg (05/14 0755) SpO2:  [91 %-100 %] 94 % (05/14 0755)  Intake/Output from previous day: 05/13 0701 - 05/14 0700 In: 1970 [P.O.:720; I.V.:1000; IV Piggyback:250] Out: 107 [Urine:4520; Blood:300] Intake/Output this shift:    Neurologic: Grossly normal  Lab Results: Lab Results  Component Value Date   WBC 7.0 07/19/2013   HGB 12.9* 07/26/2013   HCT 38.0* 07/26/2013   MCV 95.8 07/19/2013   PLT 171 07/19/2013   Lab Results  Component Value Date   INR 0.90 06/23/2010   BMET Lab Results  Component Value Date   NA 144 07/26/2013   K 4.5 07/26/2013   CL 106 07/26/2013   CO2 21 07/19/2013   GLUCOSE 134* 07/26/2013   BUN 23 07/26/2013   CREATININE 1.20 07/26/2013   CALCIUM 9.4 07/19/2013    Studies/Results: Dg Cervical Spine 2-3 Views  07/26/2013   CLINICAL DATA:  Posterior cervical fusion, old acdf was done at the level of C6-C7, per. Dr. Jeannene Patella: CERVICAL SPINE - 2-3 VIEW  COMPARISON:  MR CERVICAL SPINE W/O CM dated 06/23/2013  FINDINGS: The patient is status post anterior fusion of C6-7 and C3-4. Skin retractors are appreciated within the posterior base of the neck. A surgical probe tip projects along the inferior border of the C7-T1 facets.  IMPRESSION: Intra op cervical spine a landmark evaluation.   Electronically Signed   By: Margaree Mackintosh M.D.   On: 07/26/2013 11:54   Dg C-arm 1-60 Min  07/26/2013   CLINICAL DATA:  Posterior cervical fusion, old acdf was done at the level of C6-C7, per. Dr. Jeannene Patella: DG C-ARM 1-60 MIN  COMPARISON:  DG EPIDUROGRAM S+I dated 06/08/2013  FINDINGS: The patient has a history of  anterior fusion of C6-7. There is also anterior fusion of C3-4 partially visualized. A surgical probe tip projects posterior and inferior to the C7-T1 facet. Skin retractors are appreciated within the posterior base of the neck.  IMPRESSION: Intra op cervical spine landmarks evaluation.   Electronically Signed   By: Margaree Mackintosh M.D.   On: 07/26/2013 11:52    Assessment/Plan: Doing well except urinary retention. Continue flomax, remove foley after lunch for voiding trial   LOS: 1 day    Colton Hampton 07/27/2013, 9:35 AM

## 2013-07-27 NOTE — Progress Notes (Signed)
Patient voiding well, PVR <200. Dischrage  instructions given to to patient with wife at bedside, they both verbalized understanding.

## 2014-01-16 ENCOUNTER — Other Ambulatory Visit: Payer: Self-pay | Admitting: Family Medicine

## 2014-01-16 DIAGNOSIS — R7989 Other specified abnormal findings of blood chemistry: Secondary | ICD-10-CM

## 2014-01-16 DIAGNOSIS — R945 Abnormal results of liver function studies: Principal | ICD-10-CM

## 2014-01-22 ENCOUNTER — Ambulatory Visit
Admission: RE | Admit: 2014-01-22 | Discharge: 2014-01-22 | Disposition: A | Discharge status: Home / Self Care | Payer: BC Managed Care – PPO | Source: Ambulatory Visit | Attending: Family Medicine | Admitting: Family Medicine

## 2014-01-22 ENCOUNTER — Other Ambulatory Visit: Payer: Self-pay | Admitting: Family Medicine

## 2014-01-22 DIAGNOSIS — R945 Abnormal results of liver function studies: Principal | ICD-10-CM

## 2014-01-22 DIAGNOSIS — R7989 Other specified abnormal findings of blood chemistry: Secondary | ICD-10-CM

## 2015-06-27 DIAGNOSIS — G4733 Obstructive sleep apnea (adult) (pediatric): Secondary | ICD-10-CM | POA: Diagnosis not present

## 2015-08-05 DIAGNOSIS — I10 Essential (primary) hypertension: Secondary | ICD-10-CM | POA: Diagnosis not present

## 2015-08-05 DIAGNOSIS — R972 Elevated prostate specific antigen [PSA]: Secondary | ICD-10-CM | POA: Diagnosis not present

## 2015-08-05 DIAGNOSIS — E782 Mixed hyperlipidemia: Secondary | ICD-10-CM | POA: Diagnosis not present

## 2015-08-05 DIAGNOSIS — E119 Type 2 diabetes mellitus without complications: Secondary | ICD-10-CM | POA: Diagnosis not present

## 2015-08-07 DIAGNOSIS — Z7984 Long term (current) use of oral hypoglycemic drugs: Secondary | ICD-10-CM | POA: Diagnosis not present

## 2015-08-07 DIAGNOSIS — Z0000 Encounter for general adult medical examination without abnormal findings: Secondary | ICD-10-CM | POA: Diagnosis not present

## 2015-09-13 DIAGNOSIS — F909 Attention-deficit hyperactivity disorder, unspecified type: Secondary | ICD-10-CM | POA: Diagnosis not present

## 2015-11-09 IMAGING — RF DG C-ARM 61-120 MIN
1 series · 3 of 3 positions shown · non-contrast
Comparison: DG EPIDUROGRAM S+I dated 06/08/2013

CLINICAL DATA: Posterior cervical fusion, old acdf was done at the
level of C6-C7, per. Dr. Benrabah

EXAM:
DG C-ARM 1-60 MIN

[Series 1: run · 3 of 3 slices shown]
[im 1/3]
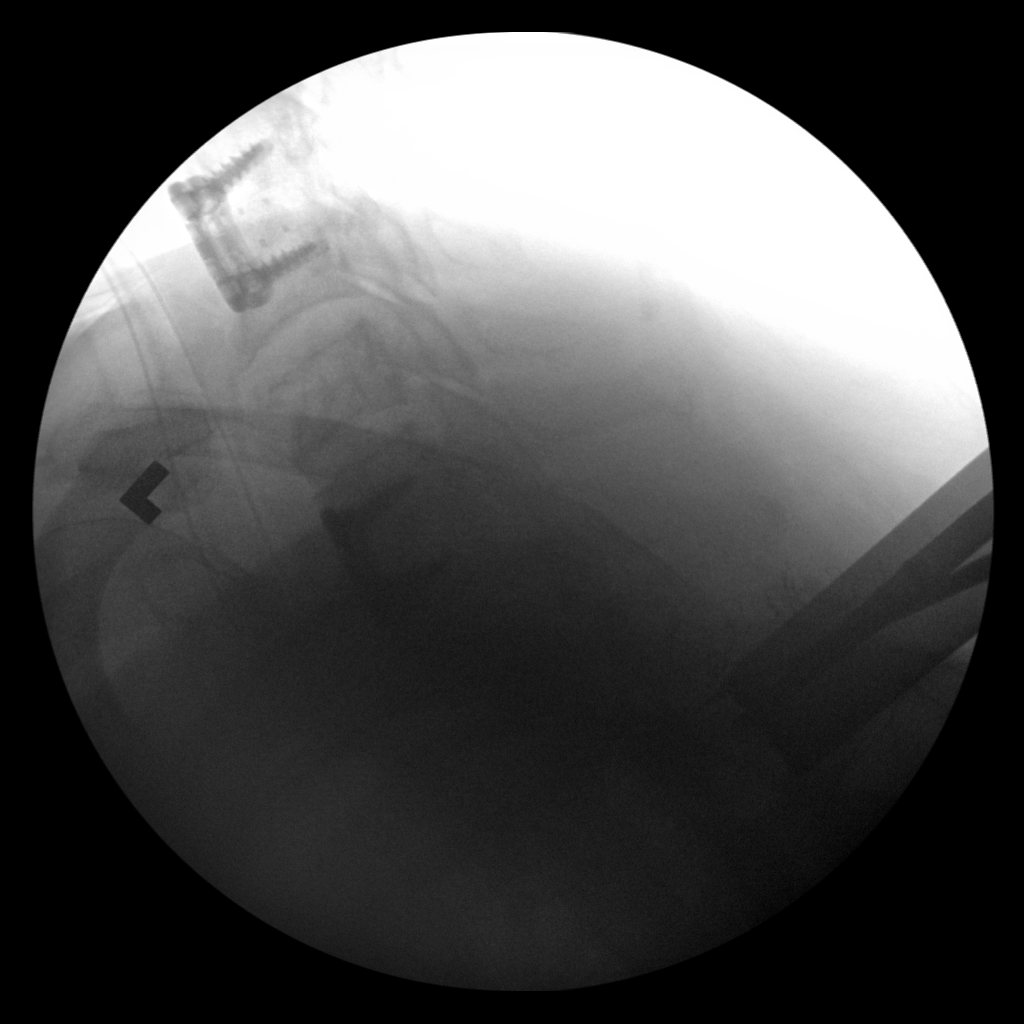
[im 2/3]
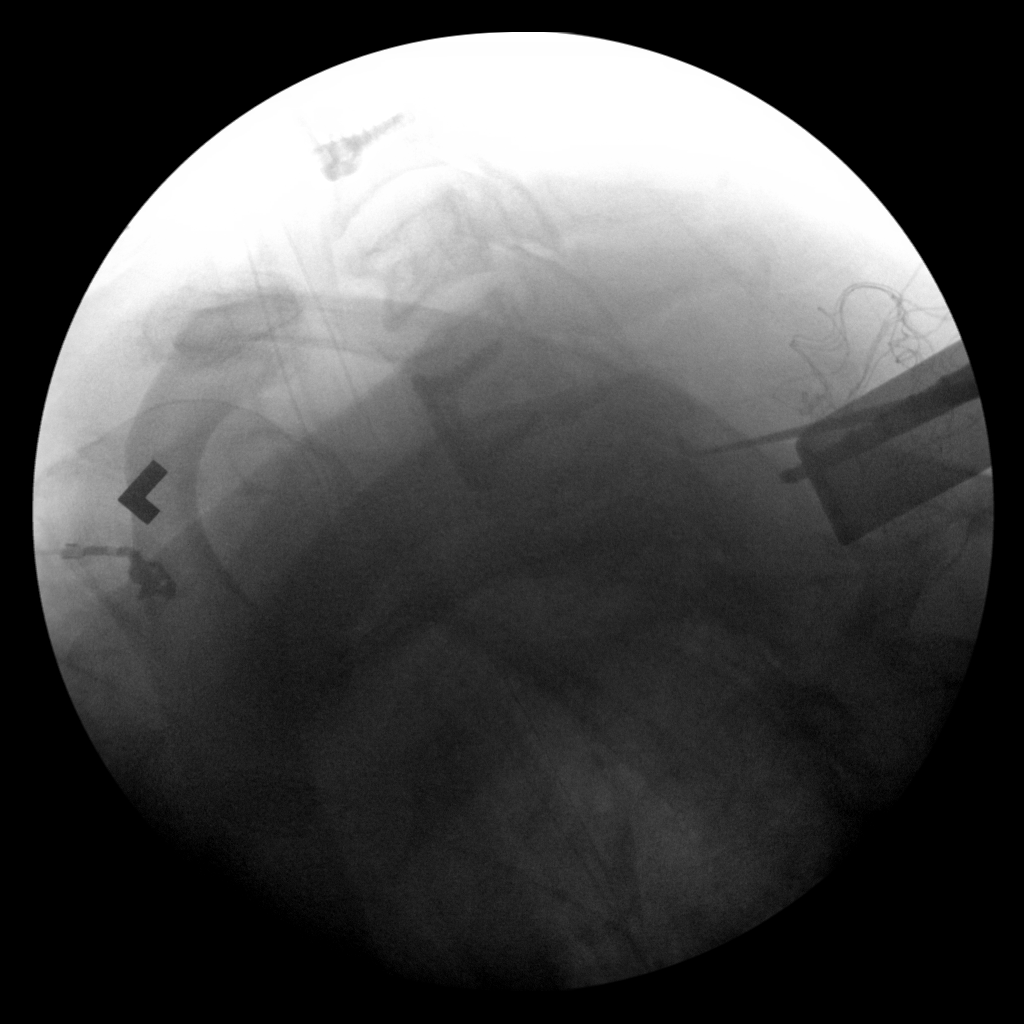
[im 3/3]
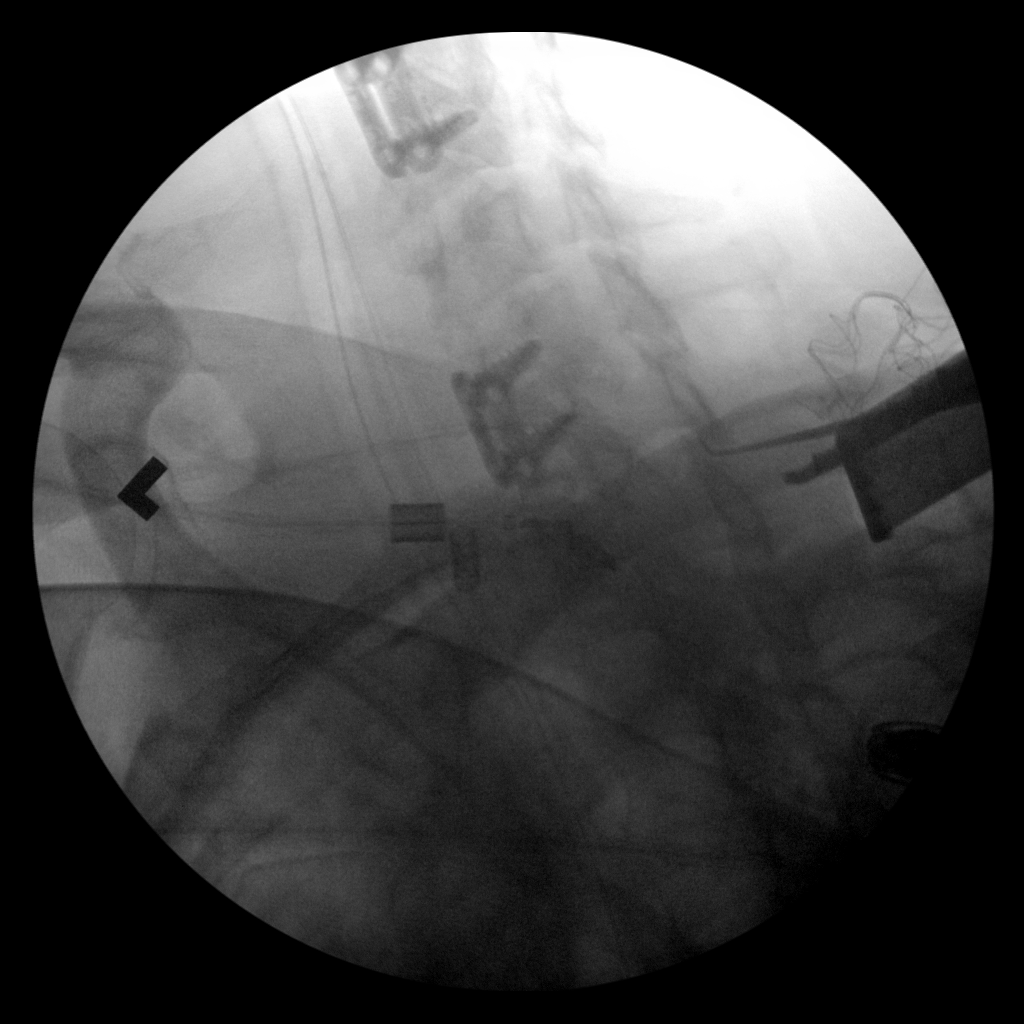

[3 of 3 positions shown; findings below may reference images not displayed]

FINDINGS: The patient has a history of anterior fusion of C6-7. There is also
anterior fusion of C3-4 partially visualized. A surgical probe tip
projects posterior and inferior to the C7-T1 facet. Skin retractors
are appreciated within the posterior base of the neck.
IMPRESSION: Intra op cervical spine landmarks evaluation.

## 2016-03-03 DIAGNOSIS — F909 Attention-deficit hyperactivity disorder, unspecified type: Secondary | ICD-10-CM | POA: Diagnosis not present

## 2016-04-22 ENCOUNTER — Ambulatory Visit (INDEPENDENT_AMBULATORY_CARE_PROVIDER_SITE_OTHER): Payer: BLUE CROSS/BLUE SHIELD | Admitting: Podiatry

## 2016-04-22 ENCOUNTER — Encounter: Payer: Self-pay | Admitting: Podiatry

## 2016-04-22 VITALS — BP 152/92 | HR 94 | Ht 70.5 in | Wt 240.0 lb

## 2016-04-22 DIAGNOSIS — Q828 Other specified congenital malformations of skin: Secondary | ICD-10-CM | POA: Diagnosis not present

## 2016-04-22 DIAGNOSIS — M216X2 Other acquired deformities of left foot: Secondary | ICD-10-CM

## 2016-04-22 DIAGNOSIS — M216X1 Other acquired deformities of right foot: Secondary | ICD-10-CM | POA: Diagnosis not present

## 2016-04-22 DIAGNOSIS — M722 Plantar fascial fibromatosis: Secondary | ICD-10-CM | POA: Diagnosis not present

## 2016-04-22 NOTE — Patient Instructions (Signed)
Seen for diabetic foot check. Noted of tight Achille tendon with firm palpable nodules on plantar fascial band bilateral. Mild pinch callus on both great toe. Reviewed daily stretch exercise as demonstrated. May benefit from custom orthotics to reduce tension in plantar fascial band. Will contact with insurance info.

## 2016-04-22 NOTE — Progress Notes (Signed)
SUBJECTIVE: 61 y.o. year old male presents for diabetic foot care. Having callus problem with great toes and palpable mass under the arch of left foot. Been diabetic since 7-8 years. Blood sugar level is under control. A1c was 6.8.  REVIEW OF SYSTEMS: Constitutional: negative Eyes: negative Ears, nose, mouth, throat, and face: negative except for Wearing hearing aids. Respiratory: negative Cardiovascular: negative Gastrointestinal: negative Genitourinary:negative Hematologic/lymphatic: negative Musculoskeletal:negative Neurological: negative, Neck surgery 3 years ago due to herniated disc. Endocrine: negative Allergic/Immunologic: negative.  OBJECTIVE: DERMATOLOGIC EXAMINATION: Pinch calluses on both great toes plantar medial. . VASCULAR EXAMINATION OF LOWER LIMBS: All pedal pulses are palpable with normal pulsation.  Capillary Filling times within 3 seconds in all digits.  No edema or erythema noted. Temperature gradient from tibial crest to dorsum of foot is within normal bilateral.  NEUROLOGIC EXAMINATION OF THE LOWER LIMBS: Achilles DTR is present and within normal. Monofilament (Semmes-Weinstein 10-gm) sensory testing positive 6 out of 6, bilateral. Vibratory sensations(128Hz  turning fork) intact at medial and lateral forefoot bilateral.  Sharp and Dull discriminatory sensations at the plantar ball of hallux is intact bilateral.   MUSCULOSKELETAL EXAMINATION: Positive for tight Achilles tendon bilateral R>L, 0 on right 5 degrees on left dorsiflexion available with knee stretched. Positive for palpable firm mass attached to plantar fascial band bilateral at center arch area. Right is about 2x2 cm, left is about 1x2 cm.  ASSESSMENT: NIDDM under control. Ankle Equinus bilateral leading to forefoot valgus and tension to plantar fascial band during weight bearing. Plantar fibroma bilateral.  PLAN: Reviewed all clinical findings and available treatment options. Reviewed  and demonstrated stretch exercise on tight Achilles tendon bilateral. Patient is to do this exercise daily. Pinch callus debrided on both feet. Reviewed the benefit of custom orthotics to reduce stress to plantar fascial band. Patient will come back for orthotics.

## 2016-04-28 DIAGNOSIS — H903 Sensorineural hearing loss, bilateral: Secondary | ICD-10-CM | POA: Diagnosis not present

## 2016-05-07 ENCOUNTER — Ambulatory Visit (INDEPENDENT_AMBULATORY_CARE_PROVIDER_SITE_OTHER): Payer: BLUE CROSS/BLUE SHIELD | Admitting: Podiatry

## 2016-05-07 DIAGNOSIS — Q828 Other specified congenital malformations of skin: Secondary | ICD-10-CM

## 2016-05-07 DIAGNOSIS — M722 Plantar fascial fibromatosis: Secondary | ICD-10-CM | POA: Diagnosis not present

## 2016-05-07 DIAGNOSIS — M216X1 Other acquired deformities of right foot: Secondary | ICD-10-CM

## 2016-05-07 DIAGNOSIS — M216X2 Other acquired deformities of left foot: Secondary | ICD-10-CM | POA: Diagnosis not present

## 2016-05-07 IMAGING — US US ABDOMEN LIMITED
1 series · 14 of 25 positions shown · non-contrast
Comparison: None.

CLINICAL DATA: Elevated LFTs.

EXAM:
US ABDOMEN LIMITED - RIGHT UPPER QUADRANT

[Series 1: us abdomen limited · 0.24mm/px · 14 of 44 slices shown]
[im 1/44]
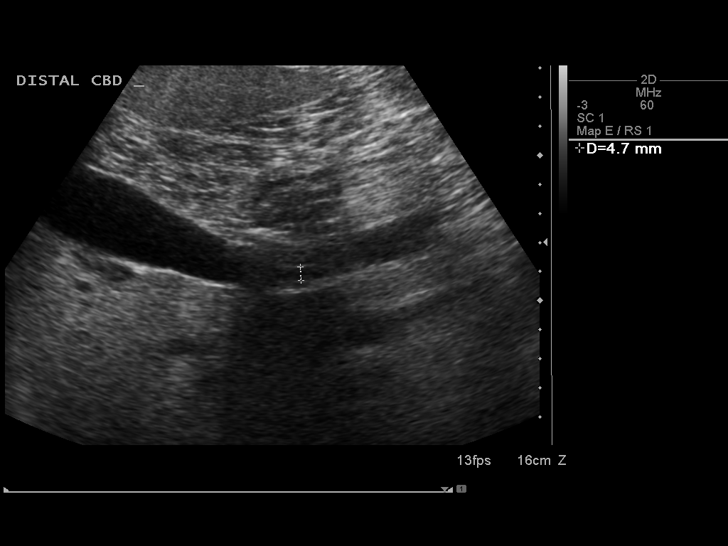
[im 4/44]
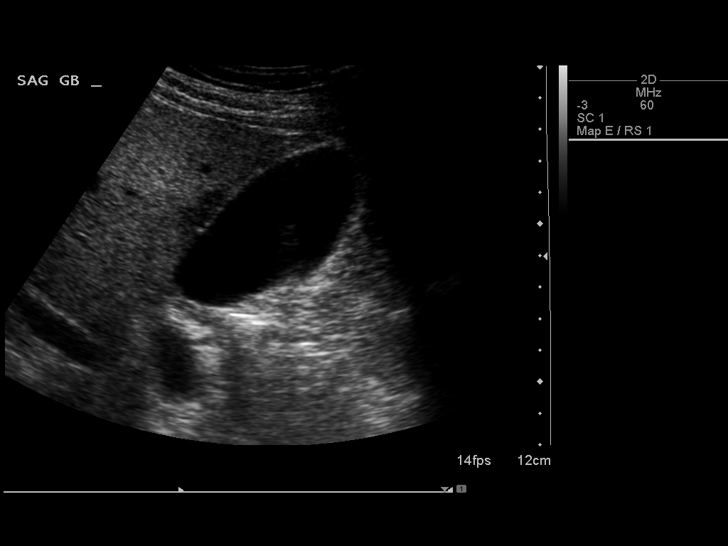
[im 8/44]
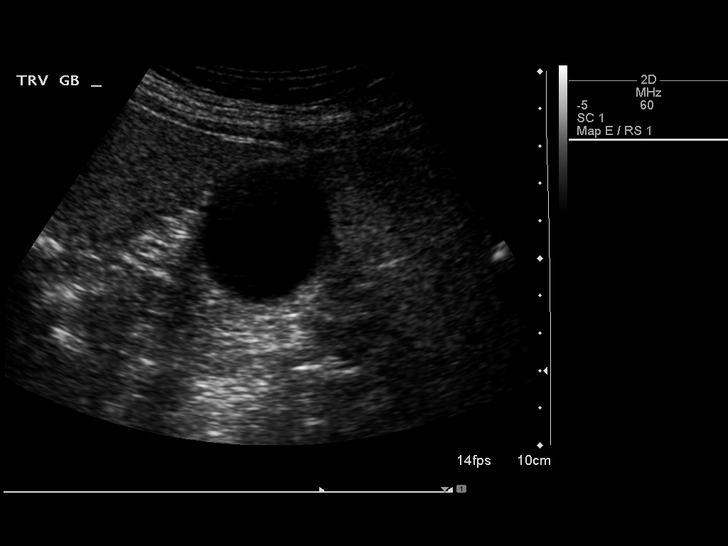
[im 11/44]
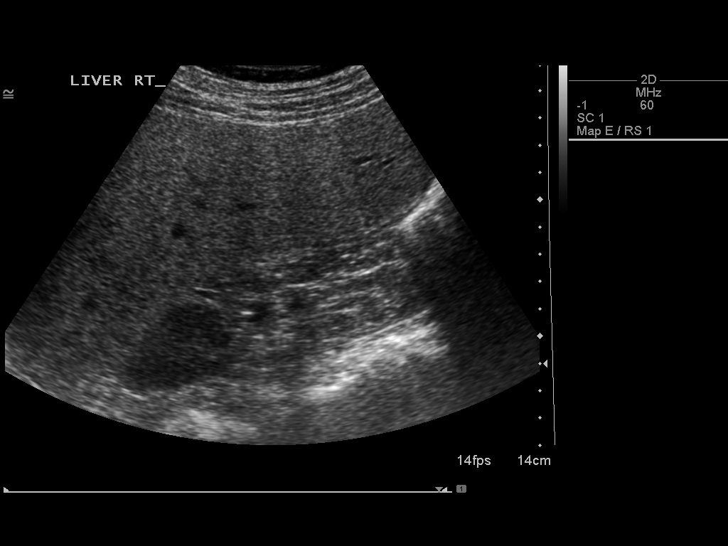
[im 15/44]
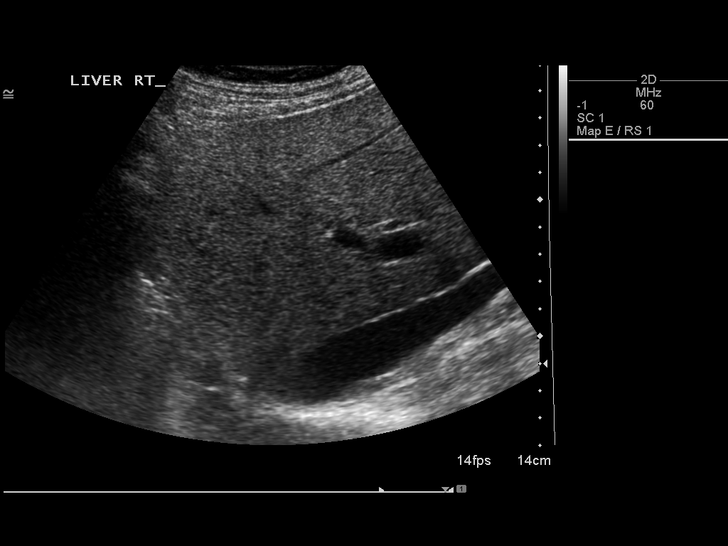
[im 17/44]
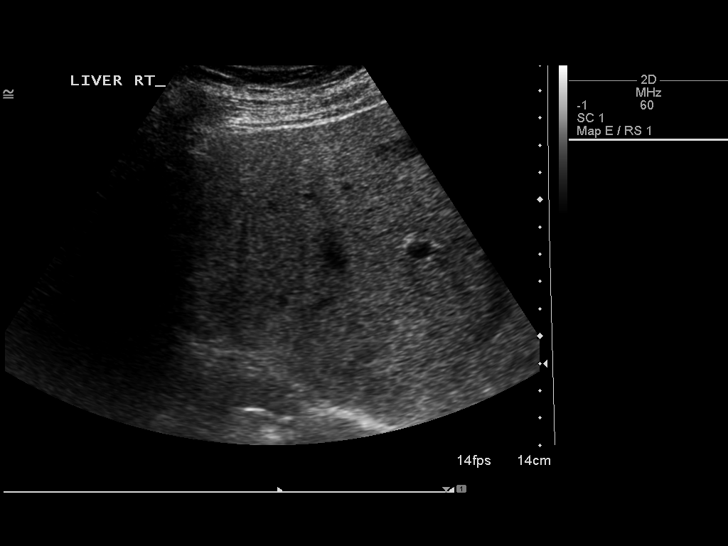
[im 20/44]
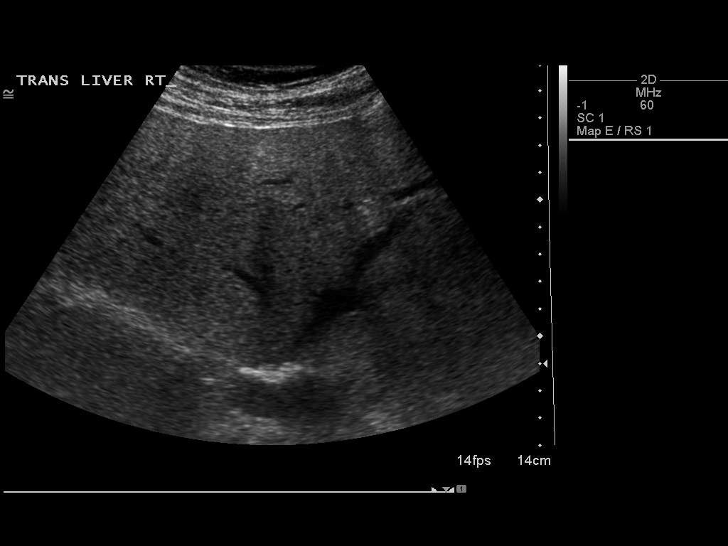
[im 24/44]
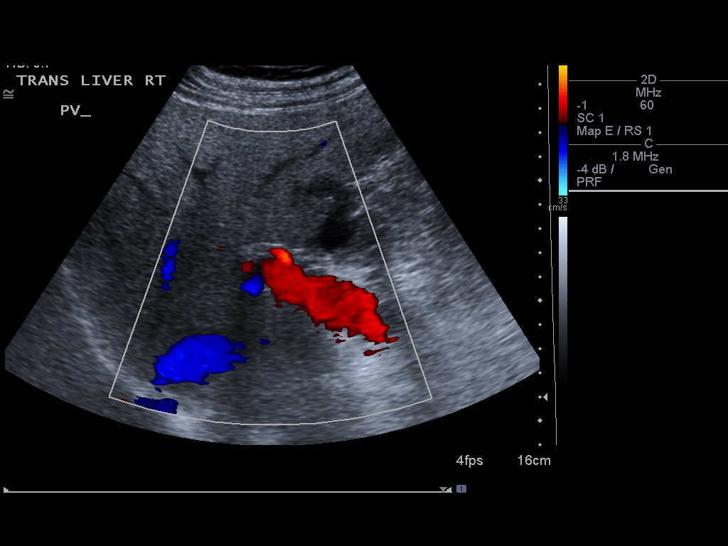
[im 27/44]
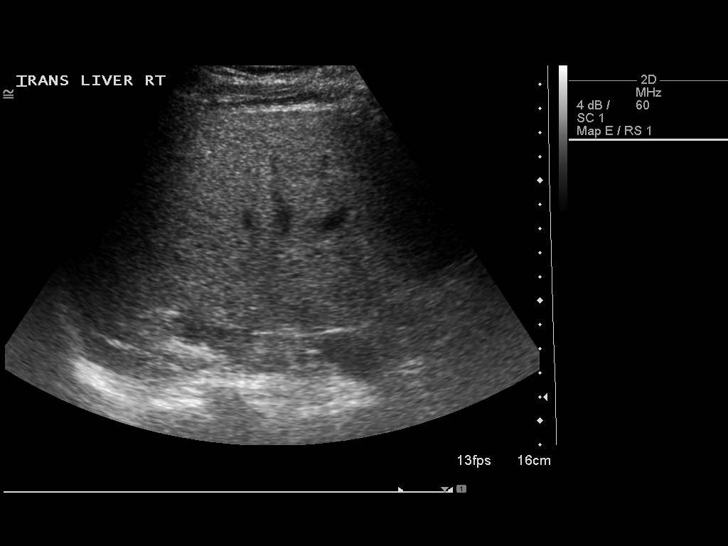
[im 29/44]
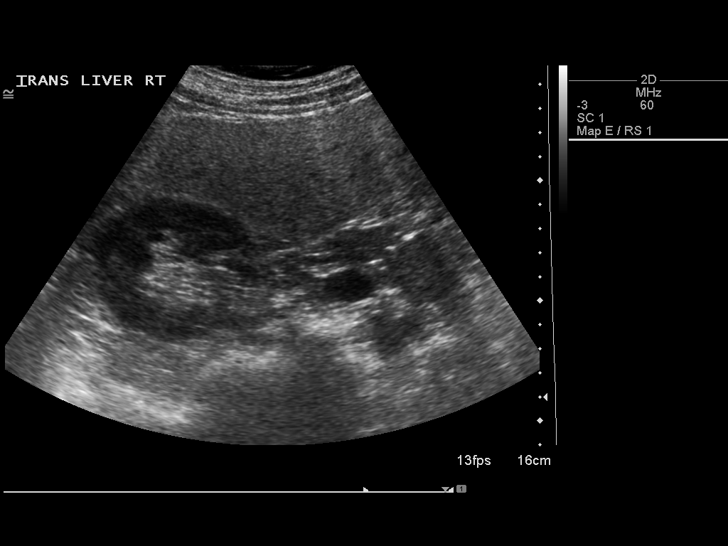
[im 33/44]
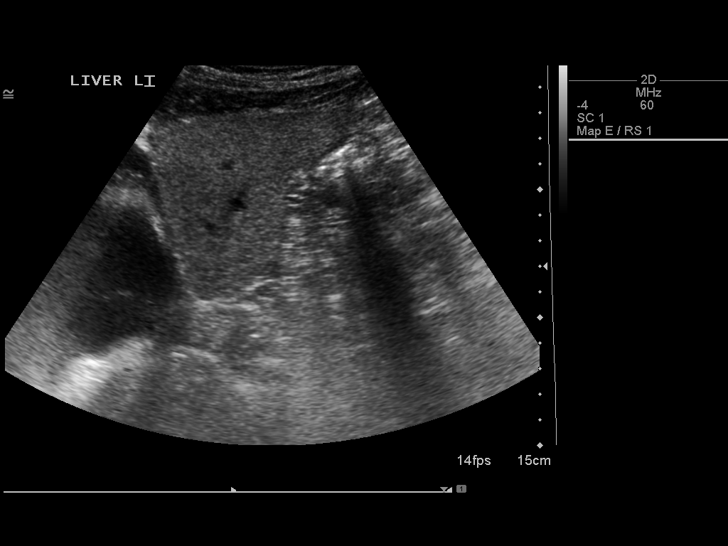
[im 36/44]
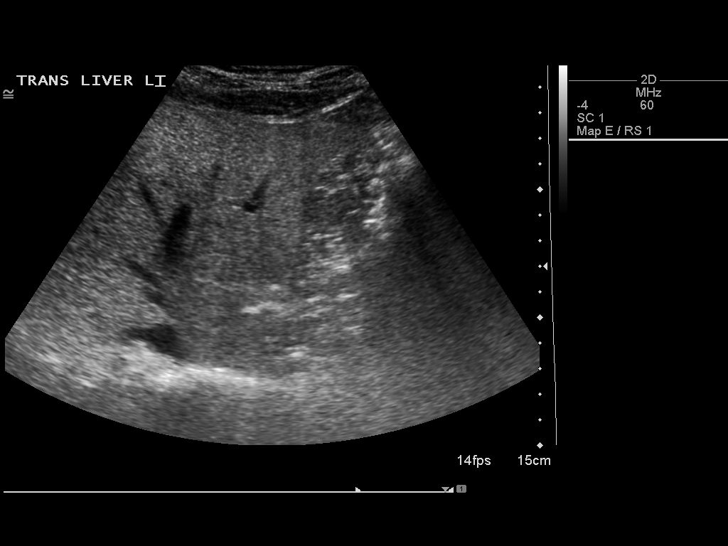
[im 40/44]
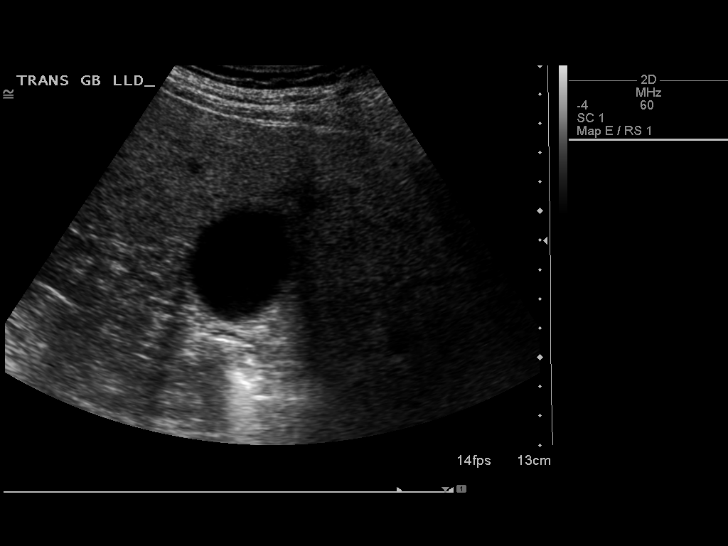
[im 44/44]
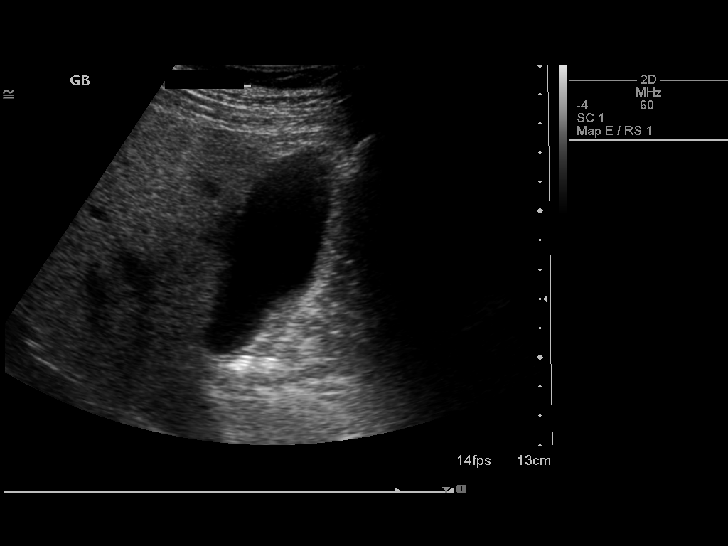

[14 of 25 positions shown; findings below may reference images not displayed]

FINDINGS: Gallbladder:

Gallbladder has a normal appearance. Gallbladder wall is 2.0 mm,
within normal limits. No stones or pericholecystic fluid. No
sonographic Murphy's sign.

Common bile duct:

Diameter: Up to 4.7 mm, normal in appearance

Liver:

Mildly heterogeneous without focal mass
IMPRESSION: No evidence for acute  abnormality.

## 2016-05-07 NOTE — Progress Notes (Signed)
SUBJECTIVE: 61 y.o. year old male presents requesting for custom orthotics that was recommended during last visit for painful plantar fibroma and calluses on both feet.  Positive for diabetic since 7-8 years. Blood sugar level is under control. A1c was 6.8.  OBJECTIVE: DERMATOLOGIC EXAMINATION: Pinch calluses on both great toes plantar medial. . VASCULAR EXAMINATION OF LOWER LIMBS: All pedal pulses are palpable with normal pulsation.  Capillary Filling times within 3 seconds in all digits.  No edema or erythema noted. Temperature gradient from tibial crest to dorsum of foot is within normal bilateral.  NEUROLOGIC EXAMINATION OF THE LOWER LIMBS: Achilles DTR is present and within normal. Monofilament (Semmes-Weinstein 10-gm) sensory testing positive 6 out of 6, bilateral. Vibratory sensations(128Hz  turning fork) intact at medial and lateral forefoot bilateral.  Sharp and Dull discriminatory sensations at the plantar ball of hallux is intact bilateral.   MUSCULOSKELETAL EXAMINATION: Positive for tight Achilles tendon bilateral R>L, 0 on right 5 degrees on left dorsiflexion available with knee stretched. Positive for palpable firm mass attached to plantar fascial band bilateral at center arch area. Right is about 2x2 cm, left is about 1x2 cm.  Radiographic examination reveal high arched cavus foot with elevated first ray, plantar and postrerior calcaneal spur, and positive of dorsal spur at right first metatarsal head.   ASSESSMENT: NIDDM under control. Ankle Equinus bilateral leading to forefoot valgus and tension to plantar fascial band during weight bearing. Plantar fibroma bilateral.  PLAN: Reviewed all clinical findings and available treatment options. Continue with stretch exercise on tight Achilles tendon bilateral. Patient is to do this exercise daily. Both feet casted for orthotics.

## 2016-05-07 NOTE — Patient Instructions (Signed)
Both feet casted for custom orthotics. Will call when they are ready.

## 2016-05-08 ENCOUNTER — Encounter: Payer: Self-pay | Admitting: Podiatry

## 2016-05-19 ENCOUNTER — Ambulatory Visit: Payer: Self-pay | Admitting: Podiatry

## 2016-05-20 DIAGNOSIS — N401 Enlarged prostate with lower urinary tract symptoms: Secondary | ICD-10-CM | POA: Diagnosis not present

## 2016-05-20 DIAGNOSIS — N5201 Erectile dysfunction due to arterial insufficiency: Secondary | ICD-10-CM | POA: Diagnosis not present

## 2016-05-20 DIAGNOSIS — R351 Nocturia: Secondary | ICD-10-CM | POA: Diagnosis not present

## 2016-05-20 DIAGNOSIS — R972 Elevated prostate specific antigen [PSA]: Secondary | ICD-10-CM | POA: Diagnosis not present

## 2016-06-25 ENCOUNTER — Encounter: Payer: Self-pay | Admitting: Podiatry

## 2016-06-25 ENCOUNTER — Ambulatory Visit (INDEPENDENT_AMBULATORY_CARE_PROVIDER_SITE_OTHER): Payer: BLUE CROSS/BLUE SHIELD | Admitting: Podiatry

## 2016-06-25 DIAGNOSIS — M216X1 Other acquired deformities of right foot: Secondary | ICD-10-CM

## 2016-06-25 DIAGNOSIS — M216X2 Other acquired deformities of left foot: Secondary | ICD-10-CM

## 2016-06-25 NOTE — Progress Notes (Signed)
Follow up on bilateral tight Achilles tendon. Left side made some improvement.  Still need daily stretch exercise on right. Return as needed.

## 2016-06-25 NOTE — Patient Instructions (Signed)
Follow up on bilateral tight Achilles tendon. Left side made some improvement. Still need daily stretch exercise on right. Return as needed.

## 2016-06-30 DIAGNOSIS — G4733 Obstructive sleep apnea (adult) (pediatric): Secondary | ICD-10-CM | POA: Diagnosis not present

## 2016-08-07 DIAGNOSIS — E119 Type 2 diabetes mellitus without complications: Secondary | ICD-10-CM | POA: Diagnosis not present

## 2016-08-07 DIAGNOSIS — Z0000 Encounter for general adult medical examination without abnormal findings: Secondary | ICD-10-CM | POA: Diagnosis not present

## 2016-08-13 DIAGNOSIS — Z0000 Encounter for general adult medical examination without abnormal findings: Secondary | ICD-10-CM | POA: Diagnosis not present

## 2016-08-13 DIAGNOSIS — E119 Type 2 diabetes mellitus without complications: Secondary | ICD-10-CM | POA: Diagnosis not present

## 2016-08-14 ENCOUNTER — Encounter: Payer: Self-pay | Admitting: *Deleted

## 2016-08-17 ENCOUNTER — Ambulatory Visit: Payer: BLUE CROSS/BLUE SHIELD | Admitting: *Deleted

## 2016-08-17 ENCOUNTER — Ambulatory Visit
Admission: RE | Admit: 2016-08-17 | Discharge: 2016-08-17 | Disposition: A | Discharge status: Home / Self Care | Payer: BLUE CROSS/BLUE SHIELD | Source: Ambulatory Visit | Attending: Unknown Physician Specialty | Admitting: Unknown Physician Specialty

## 2016-08-17 ENCOUNTER — Encounter
Admission: RE | Disposition: A | Discharge status: Home / Self Care | Payer: Self-pay | Source: Ambulatory Visit | Attending: Unknown Physician Specialty

## 2016-08-17 DIAGNOSIS — Z8371 Family history of colonic polyps: Secondary | ICD-10-CM | POA: Diagnosis not present

## 2016-08-17 DIAGNOSIS — Z87891 Personal history of nicotine dependence: Secondary | ICD-10-CM | POA: Diagnosis not present

## 2016-08-17 DIAGNOSIS — K621 Rectal polyp: Secondary | ICD-10-CM | POA: Insufficient documentation

## 2016-08-17 DIAGNOSIS — Z800 Family history of malignant neoplasm of digestive organs: Secondary | ICD-10-CM | POA: Diagnosis not present

## 2016-08-17 DIAGNOSIS — Z79899 Other long term (current) drug therapy: Secondary | ICD-10-CM | POA: Diagnosis not present

## 2016-08-17 DIAGNOSIS — Z1211 Encounter for screening for malignant neoplasm of colon: Secondary | ICD-10-CM | POA: Insufficient documentation

## 2016-08-17 DIAGNOSIS — Z7984 Long term (current) use of oral hypoglycemic drugs: Secondary | ICD-10-CM | POA: Diagnosis not present

## 2016-08-17 DIAGNOSIS — Z7982 Long term (current) use of aspirin: Secondary | ICD-10-CM | POA: Insufficient documentation

## 2016-08-17 DIAGNOSIS — K640 First degree hemorrhoids: Secondary | ICD-10-CM | POA: Insufficient documentation

## 2016-08-17 DIAGNOSIS — K635 Polyp of colon: Secondary | ICD-10-CM | POA: Diagnosis not present

## 2016-08-17 DIAGNOSIS — I10 Essential (primary) hypertension: Secondary | ICD-10-CM | POA: Diagnosis not present

## 2016-08-17 DIAGNOSIS — E119 Type 2 diabetes mellitus without complications: Secondary | ICD-10-CM | POA: Insufficient documentation

## 2016-08-17 DIAGNOSIS — G4730 Sleep apnea, unspecified: Secondary | ICD-10-CM | POA: Diagnosis not present

## 2016-08-17 DIAGNOSIS — Z86010 Personal history of colonic polyps: Secondary | ICD-10-CM | POA: Diagnosis not present

## 2016-08-17 DIAGNOSIS — D120 Benign neoplasm of cecum: Secondary | ICD-10-CM | POA: Insufficient documentation

## 2016-08-17 DIAGNOSIS — M1990 Unspecified osteoarthritis, unspecified site: Secondary | ICD-10-CM | POA: Diagnosis not present

## 2016-08-17 HISTORY — PX: COLONOSCOPY WITH PROPOFOL: SHX5780

## 2016-08-17 LAB — GLUCOSE, CAPILLARY: GLUCOSE-CAPILLARY: 113 mg/dL — AB (ref 65–99)

## 2016-08-17 SURGERY — COLONOSCOPY WITH PROPOFOL
Anesthesia: General

## 2016-08-17 MED ORDER — LIDOCAINE HCL (PF) 1 % IJ SOLN
2.0000 mL | Freq: Once | INTRAMUSCULAR | Status: AC
  Administered 2016-08-17: 0.3 mL via INTRADERMAL
  Filled 2016-08-17: qty 2

## 2016-08-17 MED ORDER — EPHEDRINE SULFATE 50 MG/ML IJ SOLN: INTRAMUSCULAR | Status: DC | PRN

## 2016-08-17 MED ORDER — PROPOFOL 10 MG/ML IV BOLUS
INTRAVENOUS | Status: AC
  Filled 2016-08-17: qty 20

## 2016-08-17 MED ORDER — PROPOFOL 500 MG/50ML IV EMUL
INTRAVENOUS | Status: AC
  Filled 2016-08-17: qty 50

## 2016-08-17 MED ORDER — PROPOFOL 10 MG/ML IV BOLUS
INTRAVENOUS | Status: DC | PRN
  Administered 2016-08-17: 400 mg via INTRAVENOUS

## 2016-08-17 MED ORDER — SODIUM CHLORIDE 0.9 % IV SOLN: INTRAVENOUS | Status: DC

## 2016-08-17 MED ORDER — SODIUM CHLORIDE 0.9 % IV SOLN
INTRAVENOUS | Status: DC
  Administered 2016-08-17: 1000 mL via INTRAVENOUS

## 2016-08-17 NOTE — Transfer of Care (Signed)
Immediate Anesthesia Transfer of Care Note  Patient: Colton Hampton  Procedure(s) Performed: Procedure(s): COLONOSCOPY WITH PROPOFOL (N/A)  Patient Location: PACU  Anesthesia Type:General  Level of Consciousness: awake, alert  and oriented  Airway & Oxygen Therapy: Patient Spontanous Breathing and Patient connected to face mask oxygen  Post-op Assessment: Report given to RN and Post -op Vital signs reviewed and stable  Post vital signs: Reviewed and stable  Last Vitals:  Vitals:   08/17/16 0942 08/17/16 1059  BP: (!) 139/95 97/77  Pulse: 90 89  Resp: 17 18  Temp: 37 C (!) 35.8 C    Last Pain:  Vitals:   08/17/16 1059  TempSrc: Tympanic         Complications: No apparent anesthesia complications

## 2016-08-17 NOTE — H&P (Signed)
Primary Care Physician:  Hulan Fess, MD Primary Gastroenterologist:  Dr. Vira Agar  Pre-Procedure History & Physical: HPI:  Colton Hampton is a 61 y.o. male is here for an colonoscopy.   Past Medical History:  Diagnosis Date  . ADD (attention deficit disorder)    J. Hughesm follows pt.- [facility]  . Arthritis    cervical spondylosis   . Complication of anesthesia    delayed urination after surgery - 2012  . Diabetes mellitus without complication ([place])   . Hypertension    went off Lisinopril approx. 6 weeks ago, due to BP being wnl since starting Invokana   . Pneumonia 1972   during [place].   . Sleep apnea 2010   CPAP- q night     Past Surgical History:  Procedure Laterality Date  . CERVICAL FUSION  2012  . LARYNGOSCOPY  2007  . POSTERIOR CERVICAL LAMINECTOMY Right 07/26/2013   Procedure: Right C7-T1 Foraminotomy;  Surgeon: Eustace Moore, MD;  Location: [facility];  Service: Neurosurgery;  Laterality: Right;  Right C7-T1 Foraminotomy    Prior to Admission medications   Medication Sig Start Date End Date Taking? Authorizing Provider  acetaminophen (TYLENOL) 325 MG tablet Take 650 mg by mouth every 6 (six) hours as needed. Patient has been taking 3-4 every 4 hours    [provider]  aspirin EC 81 MG tablet Take 81 mg by mouth daily with supper.    [provider]  Canagliflozin (INVOKANA) 100 MG TABS Take 100 mg by mouth daily before breakfast.    [provider]  doxylamine, Sleep, (UNISOM) 25 MG tablet Take 25 mg by mouth at bedtime as needed.    [provider]  glimepiride (AMARYL) 4 MG tablet Take 4 mg by mouth daily with supper.    [provider]  HYDROcodone-acetaminophen (NORCO) 10-325 MG per tablet Take 1 tablet by mouth every 6 (six) hours as needed. Patient not taking: Reported on 04/22/2016 07/26/13   Eustace Moore, MD  ibuprofen (ADVIL,MOTRIN) 200 MG tablet Take 800 mg by mouth every 4 (four) hours as  needed (pain).    [provider]  metFORMIN (GLUCOPHAGE) 1000 MG tablet Take 1,000 mg by mouth 2 (two) times daily with a meal.    [provider]  methocarbamol (ROBAXIN) 500 MG tablet Take 1 tablet (500 mg total) by mouth every 6 (six) hours as needed for muscle spasms. Patient not taking: Reported on 04/22/2016 07/26/13   Eustace Moore, MD  methylphenidate (RITALIN) 10 MG tablet Take 5 mg by mouth See admin instructions. Take 1/2 tablet (5 mg) every morning on work days, may take an additional 1/2 tablet later in the day if needed to focus    [provider]  Multiple Vitamin (MULTIVITAMIN WITH MINERALS) TABS tablet Take 1 tablet by mouth daily with supper.    [provider]  Omega-3 Fatty Acids (FISH OIL) 1000 MG CAPS Take 1,000 mg by mouth daily with supper.    [provider]  simvastatin (ZOCOR) 40 MG tablet Take 40 mg by mouth daily.    [provider]  sitaGLIPtin (JANUVIA) 100 MG tablet Take 100 mg by mouth daily.    [provider]  tamsulosin (FLOMAX) 0.4 MG CAPS capsule Take 0.4 mg by mouth daily with supper.    [provider]    Allergies as of 06/18/2016  . (No Known Allergies)    History reviewed. No pertinent family history.  Social History   Social History  . Marital status: Married    Spouse name: N/A  . Number of children: N/A  . Years of education: N/A   Occupational History  . Not on file.   Social History Main Topics  . Smoking status: Former Smoker    Quit date: 07/20/2006  . Smokeless tobacco: Never Used  . Alcohol use Yes     Comment: socially   . Drug use: No  . Sexual activity: Not on file   Other Topics Concern  . Not on file   Social History Narrative  . No narrative on file    Review of Systems: See HPI, otherwise negative ROS  Physical Exam: BP (!) 139/95   Pulse 90   Temp 98.6 F (37 C) (Tympanic)   Resp 17   Ht 5' 10.5" (1.791 m)   Wt 106.1 kg (234 lb)    SpO2 96%   BMI 33.10 kg/m  General:   Alert,  pleasant and cooperative in NAD Head:  Normocephalic and atraumatic. Neck:  Supple; no masses or thyromegaly. Lungs:  Clear throughout to auscultation.    Heart:  Regular rate and rhythm. Abdomen:  Soft, nontender and nondistended. Normal bowel sounds, without guarding, and without rebound.   Neurologic:  Alert and  oriented x4;  grossly normal neurologically.  Impression/Plan: Colton Hampton is here for an colonoscopy to be performed for [facility] colon polyps and FH colon cancer in father.  Risks, benefits, limitations, and alternatives regarding  colonoscopy have been reviewed with the patient.  Questions have been answered.  All parties agreeable.   Gaylyn Cheers, MD  08/17/2016, 10:13 AM

## 2016-08-17 NOTE — Anesthesia Preprocedure Evaluation (Signed)
Anesthesia Evaluation  Patient identified by MRN, date of birth, ID band Patient awake    Reviewed: Allergy & Precautions, NPO status , Patient's Chart, lab work & pertinent test results  Airway Mallampati: III       Dental   Pulmonary sleep apnea and Continuous Positive Airway Pressure Ventilation , former smoker,           Cardiovascular hypertension, Pt. on medications      Neuro/Psych    GI/Hepatic negative GI ROS, Neg liver ROS,   Endo/Other  diabetes, Type 2, Oral Hypoglycemic Agents  Renal/GU negative Renal ROS     Musculoskeletal   Abdominal   Peds  Hematology   Anesthesia Other Findings   Reproductive/Obstetrics                             Anesthesia Physical Anesthesia Plan  ASA: III  Anesthesia Plan: General   Post-op Pain Management:    Induction: Intravenous  Airway Management Planned: Nasal Cannula  Additional Equipment:   Intra-op Plan:   Post-operative Plan:   Informed Consent: I have reviewed the patients History and Physical, chart, labs and discussed the procedure including the risks, benefits and alternatives for the proposed anesthesia with the patient or authorized representative who has indicated his/her understanding and acceptance.     Plan Discussed with:   Anesthesia Plan Comments:         Anesthesia Quick Evaluation

## 2016-08-17 NOTE — Anesthesia Post-op Follow-up Note (Cosign Needed)
Anesthesia QCDR form completed.        

## 2016-08-17 NOTE — Op Note (Signed)
[facility] Gastroenterology Patient Name: Colton Hampton Procedure Date: 08/17/2016 10:15 AM MRN: [id number] Account #: [id number] Date of Birth: 13-Jul-1955 Admit Type: Outpatient Age: 61 Room: [facility] ENDO ROOM 1 Gender: Male Note Status: Finalized Procedure:            Colonoscopy Indications:          High risk colon cancer surveillance: Personal history                        of colonic polyps Providers:            Manya Silvas, MD Referring MD:         Hulan Fess MD (Referring MD) Medicines:            Propofol per Anesthesia Complications:        No immediate complications. Procedure:            Pre-Anesthesia Assessment:                       - After reviewing the risks and benefits, the patient                        was deemed in satisfactory condition to undergo the                        procedure.                       After obtaining informed consent, the colonoscope was                        passed under direct vision. Throughout the procedure,                        the patient's blood pressure, pulse, and oxygen                        saturations were monitored continuously. The                        Colonoscope was introduced through the anus and                        advanced to the the cecum, identified by appendiceal                        orifice and ileocecal valve. The colonoscopy was                        performed without difficulty. The patient tolerated the                        procedure well. The quality of the bowel preparation                        was good. Findings:      Two sessile polyps were found in the cecum. The polyps were small in       size. These polyps were removed with a hot snare. Resection and       retrieval were complete. To prevent bleeding after the polypectomy, one  hemostatic clip was successfully placed. There was no bleeding at the       end of the procedure.      Ten sessile polyps were  found in the sigmoid colon. The polyps were       diminutive in size. These polyps were removed with a jumbo cold forceps.       Resection and retrieval were complete.      Two sessile polyps were found in the rectum. The polyps were diminutive       in size.      Internal hemorrhoids were found during endoscopy. The hemorrhoids were       small and Grade I (internal hemorrhoids that do not prolapse).      The exam was otherwise without abnormality. Impression:           - Two small polyps in the cecum, removed with a hot                        snare. Resected and retrieved. Clip was placed.                       - Ten diminutive polyps in the sigmoid colon, removed                        with a jumbo cold forceps. Resected and retrieved.                       - Two diminutive polyps in the rectum.                       - Internal hemorrhoids.                       - The examination was otherwise normal. Recommendation:       - Await pathology results. Manya Silvas, MD 08/17/2016 10:56:17 AM This report has been signed electronically. Number of Addenda: 0 Note Initiated On: 08/17/2016 10:15 AM Scope Withdrawal Time: 0 hours 23 minutes 20 seconds  Total Procedure Duration: 0 hours 32 minutes 36 seconds       [facility]

## 2016-08-17 NOTE — Anesthesia Postprocedure Evaluation (Signed)
Anesthesia Post Note  Patient: Colton Hampton  Procedure(s) Performed: Procedure(s) (LRB): COLONOSCOPY WITH PROPOFOL (N/A)  Patient location during evaluation: Endoscopy Anesthesia Type: General Level of consciousness: awake and alert Pain management: pain level controlled Vital Signs Assessment: post-procedure vital signs reviewed and stable Respiratory status: spontaneous breathing and respiratory function stable Cardiovascular status: stable Anesthetic complications: no     Last Vitals:  Vitals:   08/17/16 1119 08/17/16 1129  BP: 131/88 132/86  Pulse: 90 81  Resp: 18 17  Temp:      Last Pain:  Vitals:   08/17/16 1059  TempSrc: Tympanic                 Fraidy Mccarrick,[name]

## 2016-08-18 ENCOUNTER — Encounter: Payer: Self-pay | Admitting: Unknown Physician Specialty

## 2016-08-19 LAB — SURGICAL PATHOLOGY

## 2016-09-01 DIAGNOSIS — F909 Attention-deficit hyperactivity disorder, unspecified type: Secondary | ICD-10-CM | POA: Diagnosis not present

## 2016-09-04 DIAGNOSIS — R319 Hematuria, unspecified: Secondary | ICD-10-CM | POA: Diagnosis not present

## 2016-09-08 DIAGNOSIS — R310 Gross hematuria: Secondary | ICD-10-CM | POA: Diagnosis not present

## 2016-09-17 DIAGNOSIS — R310 Gross hematuria: Secondary | ICD-10-CM | POA: Diagnosis not present

## 2016-10-09 DIAGNOSIS — N281 Cyst of kidney, acquired: Secondary | ICD-10-CM | POA: Diagnosis not present

## 2016-10-09 DIAGNOSIS — R310 Gross hematuria: Secondary | ICD-10-CM | POA: Diagnosis not present

## 2016-10-09 DIAGNOSIS — N401 Enlarged prostate with lower urinary tract symptoms: Secondary | ICD-10-CM | POA: Diagnosis not present

## 2016-10-09 DIAGNOSIS — R972 Elevated prostate specific antigen [PSA]: Secondary | ICD-10-CM | POA: Diagnosis not present

## 2016-10-12 DIAGNOSIS — H532 Diplopia: Secondary | ICD-10-CM | POA: Diagnosis not present

## 2016-10-22 DIAGNOSIS — R338 Other retention of urine: Secondary | ICD-10-CM | POA: Diagnosis not present

## 2016-10-22 DIAGNOSIS — N401 Enlarged prostate with lower urinary tract symptoms: Secondary | ICD-10-CM | POA: Diagnosis not present

## 2016-10-23 DIAGNOSIS — H4901 Third [oculomotor] nerve palsy, right eye: Secondary | ICD-10-CM | POA: Diagnosis not present

## 2016-10-26 DIAGNOSIS — H4901 Third [oculomotor] nerve palsy, right eye: Secondary | ICD-10-CM | POA: Diagnosis not present

## 2016-10-30 DIAGNOSIS — R338 Other retention of urine: Secondary | ICD-10-CM | POA: Diagnosis not present

## 2016-11-06 DIAGNOSIS — H4901 Third [oculomotor] nerve palsy, right eye: Secondary | ICD-10-CM | POA: Diagnosis not present

## 2017-03-25 DIAGNOSIS — Z7984 Long term (current) use of oral hypoglycemic drugs: Secondary | ICD-10-CM | POA: Diagnosis not present

## 2017-03-25 DIAGNOSIS — E119 Type 2 diabetes mellitus without complications: Secondary | ICD-10-CM | POA: Diagnosis not present

## 2017-05-19 DIAGNOSIS — R310 Gross hematuria: Secondary | ICD-10-CM | POA: Diagnosis not present

## 2017-05-19 DIAGNOSIS — N401 Enlarged prostate with lower urinary tract symptoms: Secondary | ICD-10-CM | POA: Diagnosis not present

## 2017-05-19 DIAGNOSIS — R972 Elevated prostate specific antigen [PSA]: Secondary | ICD-10-CM | POA: Diagnosis not present

## 2017-05-19 DIAGNOSIS — R351 Nocturia: Secondary | ICD-10-CM | POA: Diagnosis not present

## 2017-05-31 DIAGNOSIS — M5489 Other dorsalgia: Secondary | ICD-10-CM | POA: Diagnosis not present

## 2017-07-23 DIAGNOSIS — R351 Nocturia: Secondary | ICD-10-CM | POA: Diagnosis not present

## 2017-07-23 DIAGNOSIS — N401 Enlarged prostate with lower urinary tract symptoms: Secondary | ICD-10-CM | POA: Diagnosis not present

## 2017-07-28 ENCOUNTER — Other Ambulatory Visit: Payer: Self-pay | Admitting: Urology

## 2017-08-03 ENCOUNTER — Encounter (HOSPITAL_BASED_OUTPATIENT_CLINIC_OR_DEPARTMENT_OTHER): Payer: Self-pay | Admitting: *Deleted

## 2017-08-04 ENCOUNTER — Other Ambulatory Visit: Payer: Self-pay

## 2017-08-04 ENCOUNTER — Encounter (HOSPITAL_BASED_OUTPATIENT_CLINIC_OR_DEPARTMENT_OTHER): Payer: Self-pay | Admitting: *Deleted

## 2017-08-04 NOTE — Progress Notes (Signed)
SPOKE W/ PT WIFE, WENDY, VIA PHONE FOR PRE-OP INTERVIEW.  NPO AFTER MN.  ARRIVE AT 0945.  NEEDS ISTAT AND EKG.

## 2017-08-06 ENCOUNTER — Ambulatory Visit (HOSPITAL_BASED_OUTPATIENT_CLINIC_OR_DEPARTMENT_OTHER): Payer: BLUE CROSS/BLUE SHIELD | Admitting: Certified Registered"

## 2017-08-06 ENCOUNTER — Ambulatory Visit (HOSPITAL_BASED_OUTPATIENT_CLINIC_OR_DEPARTMENT_OTHER)
Admission: RE | Admit: 2017-08-06 | Discharge: 2017-08-06 | Disposition: A | Discharge status: Home / Self Care | Payer: BLUE CROSS/BLUE SHIELD | Source: Ambulatory Visit | Attending: Urology | Admitting: Urology

## 2017-08-06 ENCOUNTER — Other Ambulatory Visit: Payer: Self-pay

## 2017-08-06 ENCOUNTER — Encounter (HOSPITAL_BASED_OUTPATIENT_CLINIC_OR_DEPARTMENT_OTHER)
Admission: RE | Disposition: A | Discharge status: Home / Self Care | Payer: Self-pay | Source: Ambulatory Visit | Attending: Urology

## 2017-08-06 ENCOUNTER — Encounter (HOSPITAL_BASED_OUTPATIENT_CLINIC_OR_DEPARTMENT_OTHER): Payer: Self-pay | Admitting: *Deleted

## 2017-08-06 DIAGNOSIS — R339 Retention of urine, unspecified: Secondary | ICD-10-CM | POA: Insufficient documentation

## 2017-08-06 DIAGNOSIS — Z87891 Personal history of nicotine dependence: Secondary | ICD-10-CM | POA: Insufficient documentation

## 2017-08-06 DIAGNOSIS — N138 Other obstructive and reflux uropathy: Secondary | ICD-10-CM | POA: Diagnosis not present

## 2017-08-06 DIAGNOSIS — N400 Enlarged prostate without lower urinary tract symptoms: Secondary | ICD-10-CM | POA: Diagnosis not present

## 2017-08-06 DIAGNOSIS — E119 Type 2 diabetes mellitus without complications: Secondary | ICD-10-CM | POA: Insufficient documentation

## 2017-08-06 DIAGNOSIS — Z79899 Other long term (current) drug therapy: Secondary | ICD-10-CM | POA: Diagnosis not present

## 2017-08-06 DIAGNOSIS — G4730 Sleep apnea, unspecified: Secondary | ICD-10-CM | POA: Diagnosis not present

## 2017-08-06 DIAGNOSIS — G4733 Obstructive sleep apnea (adult) (pediatric): Secondary | ICD-10-CM | POA: Insufficient documentation

## 2017-08-06 DIAGNOSIS — N401 Enlarged prostate with lower urinary tract symptoms: Secondary | ICD-10-CM | POA: Insufficient documentation

## 2017-08-06 HISTORY — DX: Other acquired deformities of right foot: M21.6X1

## 2017-08-06 HISTORY — DX: Presence of external hearing-aid: Z97.4

## 2017-08-06 HISTORY — DX: Presence of dental prosthetic device (complete) (partial): K08.109

## 2017-08-06 HISTORY — DX: Type 2 diabetes mellitus without complications: E11.9

## 2017-08-06 HISTORY — DX: Other acquired deformities of left foot: M21.6X2

## 2017-08-06 HISTORY — DX: Unspecified symptoms and signs involving the genitourinary system: R39.9

## 2017-08-06 HISTORY — DX: Personal history of other specified conditions: Z87.898

## 2017-08-06 HISTORY — DX: Other obstructive and reflux uropathy: N40.1

## 2017-08-06 HISTORY — DX: Obstructive sleep apnea (adult) (pediatric): G47.33

## 2017-08-06 HISTORY — DX: Spondylosis without myelopathy or radiculopathy, cervical region: M47.812

## 2017-08-06 HISTORY — DX: Presence of spectacles and contact lenses: Z97.3

## 2017-08-06 HISTORY — DX: Presence of dental prosthetic device (complete) (partial): Z97.2

## 2017-08-06 HISTORY — PX: CYSTOSCOPY WITH INSERTION OF UROLIFT: SHX6678

## 2017-08-06 HISTORY — DX: Retention of urine, unspecified: R33.9

## 2017-08-06 HISTORY — DX: Obstructive sleep apnea (adult) (pediatric): Z99.89

## 2017-08-06 HISTORY — DX: Benign prostatic hyperplasia with lower urinary tract symptoms: N13.8

## 2017-08-06 LAB — GLUCOSE, CAPILLARY: Glucose-Capillary: 162 mg/dL — ABNORMAL HIGH (ref 65–99)

## 2017-08-06 LAB — POCT I-STAT, CHEM 8
BUN: 20 mg/dL (ref 6–20)
CREATININE: 0.8 mg/dL (ref 0.61–1.24)
Calcium, Ion: 1.24 mmol/L (ref 1.15–1.40)
Chloride: 106 mmol/L (ref 101–111)
Glucose, Bld: 171 mg/dL — ABNORMAL HIGH (ref 65–99)
HEMATOCRIT: 40 % (ref 39.0–52.0)
HEMOGLOBIN: 13.6 g/dL (ref 13.0–17.0)
Potassium: 3.9 mmol/L (ref 3.5–5.1)
SODIUM: 143 mmol/L (ref 135–145)
TCO2: 25 mmol/L (ref 22–32)

## 2017-08-06 SURGERY — CYSTOSCOPY WITH INSERTION OF UROLIFT
Anesthesia: General | Site: Prostate

## 2017-08-06 MED ORDER — FENTANYL CITRATE (PF) 100 MCG/2ML IJ SOLN
INTRAMUSCULAR | Status: DC | PRN
  Administered 2017-08-06: 50 ug via INTRAVENOUS

## 2017-08-06 MED ORDER — STERILE WATER FOR IRRIGATION IR SOLN
Status: DC | PRN
  Administered 2017-08-06: 6000 mL

## 2017-08-06 MED ORDER — FENTANYL CITRATE (PF) 100 MCG/2ML IJ SOLN
INTRAMUSCULAR | Status: AC
  Filled 2017-08-06: qty 2

## 2017-08-06 MED ORDER — MIDAZOLAM HCL 2 MG/2ML IJ SOLN
INTRAMUSCULAR | Status: AC
  Filled 2017-08-06: qty 2

## 2017-08-06 MED ORDER — MIDAZOLAM HCL 5 MG/5ML IJ SOLN
INTRAMUSCULAR | Status: DC | PRN
  Administered 2017-08-06: 2 mg via INTRAVENOUS

## 2017-08-06 MED ORDER — CEFAZOLIN SODIUM-DEXTROSE 2-4 GM/100ML-% IV SOLN
2.0000 g | INTRAVENOUS | Status: AC
  Administered 2017-08-06: 2 g via INTRAVENOUS
  Filled 2017-08-06: qty 100

## 2017-08-06 MED ORDER — LIDOCAINE 2% (20 MG/ML) 5 ML SYRINGE
INTRAMUSCULAR | Status: DC | PRN
  Administered 2017-08-06: 100 mg via INTRAVENOUS

## 2017-08-06 MED ORDER — ONDANSETRON HCL 4 MG/2ML IJ SOLN
INTRAMUSCULAR | Status: AC
Start: 2017-08-06 — End: ?
  Filled 2017-08-06: qty 2

## 2017-08-06 MED ORDER — DEXAMETHASONE SODIUM PHOSPHATE 10 MG/ML IJ SOLN
INTRAMUSCULAR | Status: DC | PRN
  Administered 2017-08-06: 5 mg via INTRAVENOUS

## 2017-08-06 MED ORDER — CEPHALEXIN 500 MG PO CAPS: 500.0000 mg | ORAL_CAPSULE | Freq: Two times a day (BID) | ORAL | 0 refills | Status: DC

## 2017-08-06 MED ORDER — CEFAZOLIN SODIUM-DEXTROSE 2-4 GM/100ML-% IV SOLN
INTRAVENOUS | Status: AC
  Filled 2017-08-06: qty 100

## 2017-08-06 MED ORDER — LACTATED RINGERS IV SOLN
INTRAVENOUS | Status: DC
  Administered 2017-08-06 (×2): via INTRAVENOUS
  Filled 2017-08-06: qty 1000

## 2017-08-06 MED ORDER — DEXAMETHASONE SODIUM PHOSPHATE 10 MG/ML IJ SOLN
INTRAMUSCULAR | Status: AC
  Filled 2017-08-06: qty 1

## 2017-08-06 MED ORDER — ONDANSETRON HCL 4 MG/2ML IJ SOLN
INTRAMUSCULAR | Status: DC | PRN
  Administered 2017-08-06: 4 mg via INTRAVENOUS

## 2017-08-06 MED ORDER — LIDOCAINE 2% (20 MG/ML) 5 ML SYRINGE
INTRAMUSCULAR | Status: AC
  Filled 2017-08-06: qty 5

## 2017-08-06 MED ORDER — PROPOFOL 10 MG/ML IV BOLUS
INTRAVENOUS | Status: AC
  Filled 2017-08-06: qty 20

## 2017-08-06 MED ORDER — PROPOFOL 10 MG/ML IV BOLUS
INTRAVENOUS | Status: DC | PRN
  Administered 2017-08-06: 200 mg via INTRAVENOUS

## 2017-08-06 MED ORDER — FENTANYL CITRATE (PF) 100 MCG/2ML IJ SOLN
25.0000 ug | INTRAMUSCULAR | Status: DC | PRN
  Filled 2017-08-06: qty 1

## 2017-08-06 MED FILL — CEPHALEXIN 500 MG CAPSULE: 500 | 5 days supply | Qty: 10 | Fill #0

## 2017-08-06 SURGICAL SUPPLY — 20 items
BAG DRAIN URO-CYSTO SKYTR STRL (DRAIN) ×2 IMPLANT
BAG DRN ANRFLXCHMBR STRAP LEK (BAG) ×1
BAG DRN UROCATH (DRAIN) ×1
BAG URINE DRAINAGE (UROLOGICAL SUPPLIES) ×2 IMPLANT
BAG URINE LEG 19OZ MD ST LTX (BAG) ×2 IMPLANT
BAG URINE LEG 500ML (DRAIN) IMPLANT
CATH FOLEY 2WAY SLVR  5CC 18FR (CATHETERS) ×1
CATH FOLEY 2WAY SLVR 5CC 18FR (CATHETERS) ×1 IMPLANT
CLOTH BEACON ORANGE TIMEOUT ST (SAFETY) ×2 IMPLANT
GLOVE BIO SURGEON STRL SZ 6.5 (GLOVE) ×2 IMPLANT
GLOVE BIO SURGEON STRL SZ8 (GLOVE) ×2 IMPLANT
GLOVE BIOGEL PI IND STRL 6.5 (GLOVE) IMPLANT
GLOVE BIOGEL PI INDICATOR 6.5 (GLOVE) ×2
GOWN STRL REUS W/TWL LRG LVL3 (GOWN DISPOSABLE) ×1 IMPLANT
GOWN STRL REUS W/TWL XL LVL3 (GOWN DISPOSABLE) ×2 IMPLANT
MANIFOLD NEPTUNE II (INSTRUMENTS) ×2 IMPLANT
PACK CYSTO (CUSTOM PROCEDURE TRAY) ×2 IMPLANT
SYSTEM UROLIFT (Male Continence) ×14 IMPLANT
TUBE CONNECTING 12X1/4 (SUCTIONS) ×2 IMPLANT
WATER STERILE IRR 3000ML UROMA (IV SOLUTION) ×4 IMPLANT

## 2017-08-06 NOTE — Discharge Instructions (Signed)
1.  Expect to see some bloody urethral drainage as well as blood in the initial part of your urinary stream for a few days  2.  If you are on a medicine for your prostate i.e. tamsulosin, alfuzosin, doxazosin or terazosin, it is okay to stop that 2-3 days after your procedure  3.  If you are on finasteride, it is okay to stop that immediately  4.  Limit your exertional activity for approximately 2 weeks after the procedure.  If you are having no bloody drainage or pain at that point, you may liberalize your activities  5.  Keep your follow-up appointment that is scheduled.  If you have problems before the appointment such as continued large clots in your urine, difficulty urinating or fever, contact us before at [phone]  6. OK to remove the catheter on Sat am if urine clearer  Post Anesthesia Home Care Instructions  Activity: Get plenty of rest for the remainder of the day. A responsible individual must stay with you for 24 hours following the procedure.  For the next 24 hours, DO NOT: -Drive a car -Paediatric nurse -Drink alcoholic beverages -Take any medication unless instructed by your physician -Make any legal decisions or sign important papers.  Meals: Start with liquid foods such as gelatin or soup. Progress to regular foods as tolerated. Avoid greasy, spicy, heavy foods. If nausea and/or vomiting occur, drink only clear liquids until the nausea and/or vomiting subsides. Call your physician if vomiting continues.  Special Instructions/Symptoms: Your throat may feel dry or sore from the anesthesia or the breathing tube placed in your throat during surgery. If this causes discomfort, gargle with warm salt water. The discomfort should disappear within 24 hours.  If you had a scopolamine patch placed behind your ear for the management of post- operative nausea and/or vomiting:  1. The medication in the patch is effective for 72 hours, after which it should be removed.  Wrap  patch in a tissue and discard in the trash. Wash hands thoroughly with soap and water. 2. You may remove the patch earlier than 72 hours if you experience unpleasant side effects which may include dry mouth, dizziness or visual disturbances. 3. Avoid touching the patch. Wash your hands with soap and water after contact with the patch.

## 2017-08-06 NOTE — Anesthesia Procedure Notes (Signed)
Procedure Name: LMA Insertion Date/Time: 08/06/2017 11:56 AM Performed by: Gwyndolyn Saxon, CRNA Pre-anesthesia Checklist: Patient identified, Emergency Drugs available, Suction available, Patient being monitored and Timeout performed Patient Re-evaluated:Patient Re-evaluated prior to induction Oxygen Delivery Method: Circle system utilized Preoxygenation: Pre-oxygenation with 100% oxygen Induction Type: IV induction Ventilation: Mask ventilation without difficulty LMA: LMA inserted LMA Size: 5.0 Number of attempts: 1 Placement Confirmation: positive ETCO2,  CO2 detector and breath sounds checked- equal and bilateral Tube secured with: Tape Dental Injury: Teeth and Oropharynx as per pre-operative assessment

## 2017-08-06 NOTE — Op Note (Signed)
Preoperative diagnosis: BPH with obstructive symptomatology.  Postoperative diagnosis: Same  Principal procedure: Urolift procedure, with the placement of 7 implants.  Surgeon: Diona Fanti  Anesthesia: Gen. with LMA  Complications: None  Drains: 18 French Foley catheter, to leg bag.  Estimated blood loss: Less than 25 mL  Indications: 62 -year-old male with obstructive symptomatology secondary to BPH.  The patient's symptoms have progressed, and he has requested further management.  Management options including TURP with resection/ablation of the prostate as well as Urolift were discussed.  The patient has chosen to have a Urolift procedure.  He has been instructed to the procedure as well as risks and complications which include but are not limited to infection, bleeding, and inadequate treatment with the Urolift procedure alone, anesthetic complications, among others.  He understands these and desires to proceed.  Findings: Using the 17 French cystoscope, urethra and bladder were inspected.  There were no urethral lesions.  Prostatic urethra was obstructed secondary to bilobar hypertrophy.  The bladder was inspected circumferentially.  This revealed normal findings.  Description of procedure: The patient was properly identified in the holding area.  He received preoperative IV antibiotics.  He was taken to the operating room where general anesthetic was administered with the LMA.  He is placed in the dorsolithotomy position.  Genitalia and perineum were prepped and draped.  Proper timeout was performed.  A 59F cystoscope was inserted into the bladder. The cystoscopy bridge was replaced with a UroLift delivery device.The first treatment site was the patient's right side approximately 1.5cm distal to the bladder neck. The distal tip of the delivery device was then angled laterally approximately 20 degrees at this position to compress the lateral lobe. The trigger was pulled, thereby deploying a  needle containing the implant through the prostate. The needle was then retracted, allowing one end of the implant to be delivered to the capsular surface of the prostate. The implant was then tensioned to assure capsular seating and removal of slack monofilament. The device was then angled back toward midline and slowly advanced proximally until cystoscopic verification of the monofilament being centered in the delivery bay. The urethral end piece was then affixed to the monofilament thereby tailoring the size of the implant. Excess filament was then severed. The delivery device was then re-advanced into the bladder. The delivery device was then replaced with cystoscope and bridge and the implant location and opening effect was confirmed cystoscopically. The same procedure was then repeated on the left side, and 2 additional implants were delivered just proximal to the verumontanum, again one on right and one on left side of the prostate, following the same technique. 2 more implants were delivered to the left prostatic lobe as well as 1 to the mid rt prostatic lobe.   A final cystoscopy was conducted first to inspect the location and state of each implant and second, to confirm the presence of a continuous anterior channel was present through the prostatic urethra with irrigation flow turned off. 7 Implants were delivered in total.There was moderate bleeding from the procedure so I felt it best to leave catheter in overnight.  Following this, the scope was removed and a 18 French Foley catheter was placed and hooked to dependent drainage.  He was then awakened and taken to the PACU in stable condition.  He tolerated the procedure well.

## 2017-08-06 NOTE — H&P (Signed)
H&P  Chief Complaint: BPH with obstruction  History of Present Illness: 62 year old male with significant lower urinary tract symptoms presents for Urolift as management for BPH.  Recent prostate ultrasound revealed glandular volume 93 mL.  Cystoscopy revealed bilobar hypertrophy without median lobe.  He is on both finasteride and tamsulosin, still with IPSS of 23.  He would like to proceed with Urolift.  Risks and complications of the procedure have been discussed with him at length.  He understands these and desires to proceed.  Past Medical History:  Diagnosis Date  . Acquired equinus deformity of both feet   . ADD (attention deficit disorder)    J. Hughesm follows pt.- [facility]  . Arthritis   . BPH with urinary obstruction   . Cervical spondylosis   . Complication of anesthesia    post op urinary retention 2012  . Full dentures   . History of urinary retention    post surgery 2012  . Incomplete emptying of bladder   . Lower urinary tract symptoms (LUTS)   . OSA on CPAP   . Type 2 diabetes mellitus ([place])   . Wears glasses   . Wears hearing aid in both ears     Past Surgical History:  Procedure Laterality Date  . ANTERIOR CERVICAL DECOMP/DISCECTOMY FUSION  06-27-2010   dr d. Ronnald Ramp  [facility]   C3-4  and  C6-7  . COLONOSCOPY WITH PROPOFOL N/A 08/17/2016   Procedure: COLONOSCOPY WITH PROPOFOL;  Surgeon: Manya Silvas, MD;  Location: [facility] ENDOSCOPY;  Service: Endoscopy;  Laterality: N/A;  . DIRECT LARYNGOSCOPY  11-13-2005   dr bates  [facility]   w/ right vocal cord stripping for bx/  rigid esophagoscopy and bronchoscopy (per path mild squamous dysplasia associated wi hyperkeatosis right vocal cord)  . POSTERIOR CERVICAL LAMINECTOMY Right 07/26/2013   Procedure: Right C7-T1 Foraminotomy;  Surgeon: Eustace Moore, MD;  Location: [facility];  Service: Neurosurgery;  Laterality: Right;  Right C7-T1 Foraminotomy    Home Medications:    Allergies: No Known Allergies  History reviewed.  No pertinent family history.  Social History:  reports that he quit smoking about 7 years ago. His smoking use included cigarettes. He quit after 25.00 years of use. He has never used smokeless tobacco. He reports that he drinks alcohol. He reports that he does not use drugs.  ROS: A complete review of systems was performed.  All systems are negative except for pertinent findings as noted.  Physical Exam:  Vital signs in last 24 hours: Temp:  [98.5 F (36.9 C)] 98.5 F (36.9 C) (05/24 0948) Pulse Rate:  [93] 93 (05/24 0948) Resp:  [18] 18 (05/24 0948) BP: (136)/(85) 136/85 (05/24 0948) SpO2:  [97 %] 97 % (05/24 0948) Weight:  [106.7 kg (235 lb 4.8 oz)] 106.7 kg (235 lb 4.8 oz) (05/24 0948) Constitutional:  Alert and oriented, No acute distress Cardiovascular: Regular rate and rhythm, No JVD Respiratory: Normal respiratory effort, Lungs clear bilaterally GI: Abdomen is soft, nontender, nondistended, no abdominal masses Genitourinary: No CVAT. Normal male phallus, testes are descended bilaterally and non-tender and without masses, scrotum is normal in appearance without lesions or masses, perineum is normal on inspection. Rectal: Normal sphincter tone, no rectal masses, prostate is non tender and without nodularity. Prostate size is estimated to be 80 cc Lymphatic: No lymphadenopathy Neurologic: Grossly intact, no focal deficits Psychiatric: Normal mood and affect  Laboratory Data:  Recent Labs    08/06/17 1053  HGB 13.6  HCT  40.0    Recent Labs    08/06/17 1053  NA 143  K 3.9  CL 106  GLUCOSE 171*  BUN 20  CREATININE 0.80     Results for orders placed or performed during the hospital encounter of 08/06/17 (from the past 24 hour(s))  I-STAT, chem 8     Status: Abnormal   Collection Time: 08/06/17 10:53 AM  Result Value Ref Range   Sodium 143 135 - 145 mmol/L   Potassium 3.9 3.5 - 5.1 mmol/L   Chloride 106 101 - 111 mmol/L   BUN 20 6 - 20 mg/dL   Creatinine, Ser  0.80 0.61 - 1.24 mg/dL   Glucose, Bld 171 (H) 65 - 99 mg/dL   Calcium, Ion 1.24 1.15 - 1.40 mmol/L   TCO2 25 22 - 32 mmol/L   Hemoglobin 13.6 13.0 - 17.0 g/dL   HCT 40.0 39.0 - 52.0 %   No results found for this or any previous visit (from the past 240 hour(s)).  Renal Function: Recent Labs    08/06/17 1053  CREATININE 0.80   Estimated Creatinine Clearance: 119 mL/min (by C-G formula based on SCr of 0.8 mg/dL).  Radiologic Imaging: No results found.  Impression/Assessment:  BPH with obstruction  Plan:  Urolift procedure

## 2017-08-06 NOTE — Anesthesia Preprocedure Evaluation (Addendum)
Anesthesia Evaluation  Patient identified by MRN, date of birth, ID band Patient awake    Reviewed: Allergy & Precautions, NPO status , Patient's Chart, lab work & pertinent test results  Airway Mallampati: II  TM Distance: >3 FB     Dental   Pulmonary sleep apnea , former smoker,    breath sounds clear to auscultation       Cardiovascular negative cardio ROS   Rhythm:Regular Rate:Normal     Neuro/Psych    GI/Hepatic negative GI ROS, Neg liver ROS,   Endo/Other  diabetes  Renal/GU negative Renal ROS     Musculoskeletal   Abdominal   Peds  Hematology   Anesthesia Other Findings   Reproductive/Obstetrics                             Anesthesia Physical Anesthesia Plan  ASA: III  Anesthesia Plan: General   Post-op Pain Management:    Induction: Intravenous  PONV Risk Score and Plan:   Airway Management Planned: LMA  Additional Equipment:   Intra-op Plan:   Post-operative Plan: Extubation in OR  Informed Consent: I have reviewed the patients History and Physical, chart, labs and discussed the procedure including the risks, benefits and alternatives for the proposed anesthesia with the patient or authorized representative who has indicated his/her understanding and acceptance.   Dental advisory given  Plan Discussed with: CRNA and Anesthesiologist  Anesthesia Plan Comments:         Anesthesia Quick Evaluation

## 2017-08-06 NOTE — Anesthesia Postprocedure Evaluation (Signed)
Anesthesia Post Note  Patient: Colton Hampton  Procedure(s) Performed: CYSTOSCOPY WITH INSERTION OF UROLIFT (N/A Prostate)     Patient location during evaluation: PACU Anesthesia Type: General Level of consciousness: awake Pain management: pain level controlled Vital Signs Assessment: post-procedure vital signs reviewed and stable Respiratory status: spontaneous breathing Cardiovascular status: stable Anesthetic complications: no    Last Vitals:  Vitals:   08/06/17 1250 08/06/17 1300  BP:  (!) 145/94  Pulse:  92  Resp:  14  Temp:    SpO2: 94% 95%    Last Pain:  Vitals:   08/06/17 1250  TempSrc:   PainSc: 0-No pain                 Kamron Portee

## 2017-08-06 NOTE — Transfer of Care (Signed)
Immediate Anesthesia Transfer of Care Note  Patient: Colton Hampton  Procedure(s) Performed: CYSTOSCOPY WITH INSERTION OF UROLIFT (N/A )  Patient Location: PACU  Anesthesia Type:General  Level of Consciousness: awake, alert  and oriented  Airway & Oxygen Therapy: Patient Spontanous Breathing and Patient connected to nasal cannula oxygen  Post-op Assessment: Report given to RN and Post -op Vital signs reviewed and stable  Post vital signs: Reviewed and stable  Last Vitals:  Vitals Value Taken Time  BP 165/97 08/06/2017 12:25 PM  Temp    Pulse 101 08/06/2017 12:28 PM  Resp 14 08/06/2017 12:28 PM  SpO2 93 % 08/06/2017 12:28 PM  Vitals shown include unvalidated device data.  Last Pain:  Vitals:   08/06/17 1028  TempSrc:   PainSc: 0-No pain      Patients Stated Pain Goal: 5 (03/24/30 3557)  Complications: No apparent anesthesia complications

## 2017-08-10 ENCOUNTER — Encounter (HOSPITAL_BASED_OUTPATIENT_CLINIC_OR_DEPARTMENT_OTHER): Payer: Self-pay | Admitting: Urology

## 2017-08-12 DIAGNOSIS — F909 Attention-deficit hyperactivity disorder, unspecified type: Secondary | ICD-10-CM | POA: Diagnosis not present

## 2017-08-13 DIAGNOSIS — N401 Enlarged prostate with lower urinary tract symptoms: Secondary | ICD-10-CM | POA: Diagnosis not present

## 2017-08-13 DIAGNOSIS — R351 Nocturia: Secondary | ICD-10-CM | POA: Diagnosis not present

## 2017-08-18 DIAGNOSIS — E1169 Type 2 diabetes mellitus with other specified complication: Secondary | ICD-10-CM | POA: Diagnosis not present

## 2017-08-18 DIAGNOSIS — E782 Mixed hyperlipidemia: Secondary | ICD-10-CM | POA: Diagnosis not present

## 2017-08-18 DIAGNOSIS — G4730 Sleep apnea, unspecified: Secondary | ICD-10-CM | POA: Diagnosis not present

## 2017-08-18 DIAGNOSIS — I10 Essential (primary) hypertension: Secondary | ICD-10-CM | POA: Diagnosis not present

## 2017-08-18 DIAGNOSIS — Z0000 Encounter for general adult medical examination without abnormal findings: Secondary | ICD-10-CM | POA: Diagnosis not present

## 2017-09-24 DIAGNOSIS — R351 Nocturia: Secondary | ICD-10-CM | POA: Diagnosis not present

## 2017-09-24 DIAGNOSIS — N5201 Erectile dysfunction due to arterial insufficiency: Secondary | ICD-10-CM | POA: Diagnosis not present

## 2017-09-24 DIAGNOSIS — N401 Enlarged prostate with lower urinary tract symptoms: Secondary | ICD-10-CM | POA: Diagnosis not present

## 2018-01-06 DIAGNOSIS — Z23 Encounter for immunization: Secondary | ICD-10-CM | POA: Diagnosis not present

## 2018-01-26 DIAGNOSIS — F909 Attention-deficit hyperactivity disorder, unspecified type: Secondary | ICD-10-CM | POA: Diagnosis not present

## 2018-03-02 DIAGNOSIS — E119 Type 2 diabetes mellitus without complications: Secondary | ICD-10-CM | POA: Diagnosis not present

## 2018-07-25 DIAGNOSIS — F909 Attention-deficit hyperactivity disorder, unspecified type: Secondary | ICD-10-CM | POA: Diagnosis not present

## 2018-08-26 DIAGNOSIS — Z23 Encounter for immunization: Secondary | ICD-10-CM | POA: Diagnosis not present

## 2018-08-26 DIAGNOSIS — E1169 Type 2 diabetes mellitus with other specified complication: Secondary | ICD-10-CM | POA: Diagnosis not present

## 2018-08-26 DIAGNOSIS — G4730 Sleep apnea, unspecified: Secondary | ICD-10-CM | POA: Diagnosis not present

## 2018-08-26 DIAGNOSIS — I10 Essential (primary) hypertension: Secondary | ICD-10-CM | POA: Diagnosis not present

## 2018-08-26 DIAGNOSIS — Z0000 Encounter for general adult medical examination without abnormal findings: Secondary | ICD-10-CM | POA: Diagnosis not present

## 2018-08-30 DIAGNOSIS — Z0000 Encounter for general adult medical examination without abnormal findings: Secondary | ICD-10-CM | POA: Diagnosis not present

## 2018-08-30 DIAGNOSIS — E1169 Type 2 diabetes mellitus with other specified complication: Secondary | ICD-10-CM | POA: Diagnosis not present

## 2018-09-22 DIAGNOSIS — D2371 Other benign neoplasm of skin of right lower limb, including hip: Secondary | ICD-10-CM | POA: Diagnosis not present

## 2018-09-22 DIAGNOSIS — L817 Pigmented purpuric dermatosis: Secondary | ICD-10-CM | POA: Diagnosis not present

## 2018-09-22 DIAGNOSIS — L821 Other seborrheic keratosis: Secondary | ICD-10-CM | POA: Diagnosis not present

## 2018-09-22 DIAGNOSIS — D2272 Melanocytic nevi of left lower limb, including hip: Secondary | ICD-10-CM | POA: Diagnosis not present

## 2018-09-22 DIAGNOSIS — D485 Neoplasm of uncertain behavior of skin: Secondary | ICD-10-CM | POA: Diagnosis not present

## 2018-10-27 DIAGNOSIS — R351 Nocturia: Secondary | ICD-10-CM | POA: Diagnosis not present

## 2018-10-27 DIAGNOSIS — N401 Enlarged prostate with lower urinary tract symptoms: Secondary | ICD-10-CM | POA: Diagnosis not present

## 2018-11-04 DIAGNOSIS — R8271 Bacteriuria: Secondary | ICD-10-CM | POA: Diagnosis not present

## 2018-11-04 DIAGNOSIS — R350 Frequency of micturition: Secondary | ICD-10-CM | POA: Diagnosis not present

## 2018-11-04 DIAGNOSIS — N401 Enlarged prostate with lower urinary tract symptoms: Secondary | ICD-10-CM | POA: Diagnosis not present

## 2018-11-04 DIAGNOSIS — R3914 Feeling of incomplete bladder emptying: Secondary | ICD-10-CM | POA: Diagnosis not present

## 2022-02-25 ENCOUNTER — Other Ambulatory Visit: Payer: Self-pay | Admitting: Urology

## 2022-03-24 ENCOUNTER — Encounter (HOSPITAL_BASED_OUTPATIENT_CLINIC_OR_DEPARTMENT_OTHER): Payer: Self-pay | Admitting: Urology

## 2022-03-24 NOTE — Progress Notes (Signed)
Spoke w/ via phone for pre-op interview--- Abigail Butts pt spouse Lab needs dos---- ISTAT and EKG, CBG              Lab results------ COVID test -----patient states asymptomatic no test needed Arrive at -------0700 NPO after MN NO Solid Food.  Clear liquids from MN until---0600 Med rec completed Medications to take morning of surgery ----- Flomax, Atenolol and Sulfa Diabetic medication -----NONE AM of surgery Patient instructed no nail polish to be worn day of surgery Patient instructed to bring photo id and insurance card day of surgery Patient aware to have Driver (ride ) / caregiver  WIfe Abigail Butts   for 24 hours after surgery  Patient Special Instructions ----- Pre-Op special Istructions ----- Patient verbalized understanding of instructions that were given at this phone interview. Patient denies shortness of breath, chest pain, fever, cough at this phone interview.  Patient is OWER, [place] guidelines discussed with patients wife, and discharge time of 0900 the next morning, verbalized understanding.

## 2022-03-25 ENCOUNTER — Encounter (HOSPITAL_BASED_OUTPATIENT_CLINIC_OR_DEPARTMENT_OTHER): Payer: Self-pay | Admitting: Urology

## 2022-03-25 NOTE — Anesthesia Preprocedure Evaluation (Signed)
Anesthesia Evaluation  Patient identified by MRN, date of birth, ID band Patient awake    Reviewed: Allergy & Precautions, NPO status , Patient's Chart, lab work & pertinent test results  Airway Mallampati: II  TM Distance: >3 FB Neck ROM: Full    Dental  (+) Upper Dentures, Lower Dentures   Pulmonary sleep apnea , former smoker   Pulmonary exam normal        Cardiovascular hypertension, Pt. on medications and Pt. on home beta blockers  Rhythm:Regular Rate:Normal     Neuro/Psych negative neurological ROS     GI/Hepatic negative GI ROS, Neg liver ROS,,,  Endo/Other  diabetes, Type 2, Oral Hypoglycemic Agents    Renal/GU negative Renal ROS   BPH    Musculoskeletal  (+) Arthritis , Osteoarthritis,    Abdominal Normal abdominal exam  (+)   Peds  (+) ATTENTION DEFICIT DISORDER WITHOUT HYPERACTIVITY Hematology negative hematology ROS (+)   Anesthesia Other Findings   Reproductive/Obstetrics                             Anesthesia Physical Anesthesia Plan  ASA: 3  Anesthesia Plan: General   Post-op Pain Management: Celebrex PO (pre-op)* and Tylenol PO (pre-op)*   Induction: Intravenous  PONV Risk Score and Plan: 2 and Ondansetron, Dexamethasone, Midazolam and Treatment may vary due to age or medical condition  Airway Management Planned: Mask and LMA  Additional Equipment: None  Intra-op Plan:   Post-operative Plan: Extubation in OR  Informed Consent: I have reviewed the patients History and Physical, chart, labs and discussed the procedure including the risks, benefits and alternatives for the proposed anesthesia with the patient or authorized representative who has indicated his/her understanding and acceptance.     Dental advisory given  Plan Discussed with: CRNA  Anesthesia Plan Comments:        Anesthesia Quick Evaluation

## 2022-03-25 NOTE — H&P (Signed)
H&P  Chief Complaint: BPH  History of Present Illness: 67 year old male with longstanding history of BPH.  He has been on maximal medical management recently.  He did fail UroLift procedure which was done within the past 2 to 3 years.  He has had increasing urinary residual volumes and now is catheter dependent.  I discussed further management with him.  He has decided on TURP.  Past Medical History:  Diagnosis Date   Acquired equinus deformity of both feet    ADD (attention deficit disorder)    J. Hughesm follows pt.- [facility]   Arthritis    BPH with urinary obstruction    Cervical spondylosis    Complication of anesthesia    post op urinary retention 2012   Full dentures    History of urinary retention    post surgery 2012   Hypertension    Incomplete emptying of bladder    Lower urinary tract symptoms (LUTS)    Sleep apnea    Per wife pt no longer has sleep apnea and does not wear cpap.   Type 2 diabetes mellitus ([facility])    Wears glasses    Wears hearing aid in both ears     Past Surgical History:  Procedure Laterality Date   ANTERIOR CERVICAL DECOMP/DISCECTOMY FUSION  06-27-2010   dr d. Ronnald Ramp  [facility]   C3-4  and  C6-7   COLONOSCOPY WITH PROPOFOL N/A 08/17/2016   Procedure: COLONOSCOPY WITH PROPOFOL;  Surgeon: Manya Silvas, MD;  Location: [facility] ENDOSCOPY;  Service: Endoscopy;  Laterality: N/A;   CYSTOSCOPY WITH INSERTION OF UROLIFT N/A 08/06/2017   Procedure: CYSTOSCOPY WITH INSERTION OF UROLIFT;  Surgeon: Franchot Gallo, MD;  Location: [facility];  Service: Urology;  Laterality: N/A;   DIRECT LARYNGOSCOPY  11-13-2005   dr Redmond Baseman  [facility]   w/ right vocal cord stripping for bx/  rigid esophagoscopy and bronchoscopy (per path mild squamous dysplasia associated wi hyperkeatosis right vocal cord)   POSTERIOR CERVICAL LAMINECTOMY Right 07/26/2013   Procedure: Right C7-T1 Foraminotomy;  Surgeon: Eustace Moore, MD;  Location: [facility];  Service: Neurosurgery;   Laterality: Right;  Right C7-T1 Foraminotomy    Home Medications:  Allergies as of 03/25/2022   No Known Allergies      Medication List      Notice   Cannot display discharge medications because the patient has not yet been admitted.     Allergies: No Known Allergies  History reviewed. No pertinent family history.  Social History:  reports that he quit smoking about 11 years ago. His smoking use included cigarettes. He has never used smokeless tobacco. He reports current alcohol use. He reports that he does not use drugs.  ROS: A complete review of systems was performed.  All systems are negative except for pertinent findings as noted.  Physical Exam:  Vital signs in last 24 hours: Ht '5\' 11"'$  (1.803 m)   Wt 104.3 kg   BMI 32.08 kg/m  Constitutional:  Alert and oriented, No acute distress Cardiovascular: Regular rate  Respiratory: Normal respiratory effort GI: Abdomen is soft, nontender, nondistended, no abdominal masses. No CVAT.  Genitourinary: Normal male phallus, testes are descended bilaterally and non-tender and without masses, scrotum is normal in appearance without lesions or masses, perineum is normal on inspection. Lymphatic: No lymphadenopathy Neurologic: Grossly intact, no focal deficits Psychiatric: Normal mood and affect  I have reviewed prior pt notes  I have reviewed notes from referring/previous physicians  I have  reviewed urinalysis results  I have independently reviewed prior imaging  I have reviewed prior PSA results  I have reviewed prior urine culture   Impression/Assessment:  BPH with obstruction  Plan:  TURP

## 2022-03-26 ENCOUNTER — Encounter (HOSPITAL_BASED_OUTPATIENT_CLINIC_OR_DEPARTMENT_OTHER): Payer: Self-pay | Admitting: Urology

## 2022-03-26 ENCOUNTER — Ambulatory Visit (HOSPITAL_BASED_OUTPATIENT_CLINIC_OR_DEPARTMENT_OTHER): Payer: Medicare Other | Admitting: Anesthesiology

## 2022-03-26 ENCOUNTER — Other Ambulatory Visit: Payer: Self-pay

## 2022-03-26 ENCOUNTER — Encounter (HOSPITAL_BASED_OUTPATIENT_CLINIC_OR_DEPARTMENT_OTHER)
Admission: RE | Disposition: A | Discharge status: Home / Self Care | Payer: Self-pay | Source: Ambulatory Visit | Attending: Urology

## 2022-03-26 ENCOUNTER — Observation Stay (HOSPITAL_BASED_OUTPATIENT_CLINIC_OR_DEPARTMENT_OTHER)
Admission: RE | Admit: 2022-03-26 | Discharge: 2022-03-27 | Disposition: A | Discharge status: Home / Self Care | Payer: Medicare Other | Source: Ambulatory Visit | Attending: Urology | Admitting: Urology

## 2022-03-26 DIAGNOSIS — N138 Other obstructive and reflux uropathy: Secondary | ICD-10-CM | POA: Diagnosis present

## 2022-03-26 DIAGNOSIS — N401 Enlarged prostate with lower urinary tract symptoms: Secondary | ICD-10-CM | POA: Diagnosis present

## 2022-03-26 DIAGNOSIS — I10 Essential (primary) hypertension: Secondary | ICD-10-CM | POA: Diagnosis not present

## 2022-03-26 DIAGNOSIS — Z87891 Personal history of nicotine dependence: Secondary | ICD-10-CM | POA: Insufficient documentation

## 2022-03-26 DIAGNOSIS — N400 Enlarged prostate without lower urinary tract symptoms: Secondary | ICD-10-CM | POA: Diagnosis not present

## 2022-03-26 DIAGNOSIS — E119 Type 2 diabetes mellitus without complications: Secondary | ICD-10-CM | POA: Insufficient documentation

## 2022-03-26 DIAGNOSIS — N320 Bladder-neck obstruction: Secondary | ICD-10-CM | POA: Diagnosis not present

## 2022-03-26 DIAGNOSIS — G4730 Sleep apnea, unspecified: Secondary | ICD-10-CM | POA: Diagnosis not present

## 2022-03-26 HISTORY — PX: TRANSURETHRAL RESECTION OF PROSTATE: SHX73

## 2022-03-26 LAB — POCT I-STAT, CHEM 8
BUN: 25 mg/dL — ABNORMAL HIGH (ref 8–23)
Calcium, Ion: 1.2 mmol/L (ref 1.15–1.40)
Chloride: 108 mmol/L (ref 98–111)
Creatinine, Ser: 1 mg/dL (ref 0.61–1.24)
Glucose, Bld: 195 mg/dL — ABNORMAL HIGH (ref 70–99)
HCT: 45 % (ref 39.0–52.0)
Hemoglobin: 15.3 g/dL (ref 13.0–17.0)
Potassium: 4.2 mmol/L (ref 3.5–5.1)
Sodium: 141 mmol/L (ref 135–145)
TCO2: 21 mmol/L — ABNORMAL LOW (ref 22–32)

## 2022-03-26 LAB — GLUCOSE, CAPILLARY
Glucose-Capillary: 198 mg/dL — ABNORMAL HIGH (ref 70–99)
Glucose-Capillary: 181 mg/dL — ABNORMAL HIGH (ref 70–99)
Glucose-Capillary: 160 mg/dL — ABNORMAL HIGH (ref 70–99)
Glucose-Capillary: 128 mg/dL — ABNORMAL HIGH (ref 70–99)

## 2022-03-26 SURGERY — TURP (TRANSURETHRAL RESECTION OF PROSTATE)
Anesthesia: General | Site: Prostate

## 2022-03-26 MED ORDER — STERILE WATER FOR IRRIGATION IR SOLN
Status: DC | PRN
  Administered 2022-03-26: 500 mL

## 2022-03-26 MED ORDER — SIMVASTATIN 40 MG PO TABS
40.0000 mg | ORAL_TABLET | Freq: Every evening | ORAL | Status: DC
  Administered 2022-03-26: 40 mg via ORAL
  Filled 2022-03-26 (×2): qty 1

## 2022-03-26 MED ORDER — PHENYLEPHRINE HCL (PRESSORS) 10 MG/ML IV SOLN
INTRAVENOUS | Status: DC | PRN
  Administered 2022-03-26 (×8): 80 ug via INTRAVENOUS
  Administered 2022-03-26: 160 ug via INTRAVENOUS
  Administered 2022-03-26 (×2): 80 ug via INTRAVENOUS

## 2022-03-26 MED ORDER — MIDAZOLAM HCL 2 MG/2ML IJ SOLN
INTRAMUSCULAR | Status: AC
  Filled 2022-03-26: qty 2

## 2022-03-26 MED ORDER — MIDAZOLAM HCL 2 MG/2ML IJ SOLN
INTRAMUSCULAR | Status: DC | PRN
  Administered 2022-03-26 (×2): 1 mg via INTRAVENOUS

## 2022-03-26 MED ORDER — PROPOFOL 10 MG/ML IV BOLUS
INTRAVENOUS | Status: AC
  Filled 2022-03-26: qty 20

## 2022-03-26 MED ORDER — DEXAMETHASONE SODIUM PHOSPHATE 10 MG/ML IJ SOLN
INTRAMUSCULAR | Status: DC | PRN
  Administered 2022-03-26: 5 mg via INTRAVENOUS

## 2022-03-26 MED ORDER — LISINOPRIL 10 MG PO TABS
10.0000 mg | ORAL_TABLET | Freq: Every day | ORAL | Status: DC
  Administered 2022-03-26: 10 mg via ORAL
  Filled 2022-03-26 (×2): qty 1

## 2022-03-26 MED ORDER — LIDOCAINE HCL (PF) 2 % IJ SOLN
INTRAMUSCULAR | Status: AC
  Filled 2022-03-26: qty 5

## 2022-03-26 MED ORDER — ONDANSETRON HCL 4 MG/2ML IJ SOLN
INTRAMUSCULAR | Status: AC
  Filled 2022-03-26: qty 2

## 2022-03-26 MED ORDER — EPHEDRINE SULFATE (PRESSORS) 50 MG/ML IJ SOLN
INTRAMUSCULAR | Status: DC | PRN
  Administered 2022-03-26 (×2): 5 mg via INTRAVENOUS

## 2022-03-26 MED ORDER — INSULIN ASPART 100 UNIT/ML IJ SOLN: 0.0000 [IU] | Freq: Every day | INTRAMUSCULAR | Status: DC

## 2022-03-26 MED ORDER — HYDROCODONE-ACETAMINOPHEN 5-325 MG PO TABS
ORAL_TABLET | ORAL | Status: AC
  Filled 2022-03-26: qty 1

## 2022-03-26 MED ORDER — EPHEDRINE 5 MG/ML INJ
INTRAVENOUS | Status: AC
  Filled 2022-03-26: qty 5

## 2022-03-26 MED ORDER — SENNA 8.6 MG PO TABS
1.0000 | ORAL_TABLET | Freq: Two times a day (BID) | ORAL | Status: DC
  Administered 2022-03-26 (×2): 8.6 mg via ORAL

## 2022-03-26 MED ORDER — FENTANYL CITRATE (PF) 100 MCG/2ML IJ SOLN
25.0000 ug | INTRAMUSCULAR | Status: DC | PRN
  Administered 2022-03-26 (×2): 25 ug via INTRAVENOUS

## 2022-03-26 MED ORDER — DEXAMETHASONE SODIUM PHOSPHATE 10 MG/ML IJ SOLN
INTRAMUSCULAR | Status: AC
  Filled 2022-03-26: qty 1

## 2022-03-26 MED ORDER — SODIUM CHLORIDE 0.9 % IR SOLN
Status: DC | PRN
  Administered 2022-03-26 (×15): 3000 mL

## 2022-03-26 MED ORDER — PHENYLEPHRINE HCL-NACL 20-0.9 MG/250ML-% IV SOLN: INTRAVENOUS | Status: DC | PRN

## 2022-03-26 MED ORDER — ONDANSETRON HCL 4 MG/2ML IJ SOLN
INTRAMUSCULAR | Status: DC | PRN
  Administered 2022-03-26: 4 mg via INTRAVENOUS

## 2022-03-26 MED ORDER — FENTANYL CITRATE (PF) 100 MCG/2ML IJ SOLN
INTRAMUSCULAR | Status: AC
  Filled 2022-03-26: qty 2

## 2022-03-26 MED ORDER — CEFAZOLIN SODIUM-DEXTROSE 2-4 GM/100ML-% IV SOLN
INTRAVENOUS | Status: AC
  Filled 2022-03-26: qty 100

## 2022-03-26 MED ORDER — PHENYLEPHRINE HCL (PRESSORS) 10 MG/ML IV SOLN
INTRAVENOUS | Status: AC
  Filled 2022-03-26: qty 1

## 2022-03-26 MED ORDER — FENTANYL CITRATE (PF) 100 MCG/2ML IJ SOLN
INTRAMUSCULAR | Status: DC | PRN
  Administered 2022-03-26: 12.5 ug via INTRAVENOUS
  Administered 2022-03-26: 25 ug via INTRAVENOUS
  Administered 2022-03-26: 50 ug via INTRAVENOUS
  Administered 2022-03-26: 12.5 ug via INTRAVENOUS

## 2022-03-26 MED ORDER — SODIUM CHLORIDE 0.9 % IR SOLN
3000.0000 mL | Status: DC
  Administered 2022-03-26: 3000 mL

## 2022-03-26 MED ORDER — CELECOXIB 200 MG PO CAPS
200.0000 mg | ORAL_CAPSULE | Freq: Once | ORAL | Status: AC
  Administered 2022-03-26: 200 mg via ORAL

## 2022-03-26 MED ORDER — SENNA 8.6 MG PO TABS
ORAL_TABLET | ORAL | Status: AC
  Filled 2022-03-26: qty 1

## 2022-03-26 MED ORDER — 0.9 % SODIUM CHLORIDE (POUR BTL) OPTIME
TOPICAL | Status: DC | PRN
  Administered 2022-03-26 (×3): 500 mL

## 2022-03-26 MED ORDER — CELECOXIB 200 MG PO CAPS
ORAL_CAPSULE | ORAL | Status: AC
  Filled 2022-03-26: qty 1

## 2022-03-26 MED ORDER — ACETAMINOPHEN 500 MG PO TABS
1000.0000 mg | ORAL_TABLET | Freq: Once | ORAL | Status: AC
  Administered 2022-03-26: 1000 mg via ORAL

## 2022-03-26 MED ORDER — INSULIN ASPART 100 UNIT/ML IJ SOLN
INTRAMUSCULAR | Status: AC
  Filled 2022-03-26: qty 1

## 2022-03-26 MED ORDER — INSULIN ASPART 100 UNIT/ML IJ SOLN
0.0000 [IU] | Freq: Three times a day (TID) | INTRAMUSCULAR | Status: DC
  Administered 2022-03-26: 3 [IU] via SUBCUTANEOUS

## 2022-03-26 MED ORDER — PROPOFOL 10 MG/ML IV BOLUS
INTRAVENOUS | Status: DC | PRN
  Administered 2022-03-26: 30 mg via INTRAVENOUS
  Administered 2022-03-26: 20 mg via INTRAVENOUS
  Administered 2022-03-26: 200 mg via INTRAVENOUS

## 2022-03-26 MED ORDER — OXYBUTYNIN CHLORIDE 5 MG PO TABS: 5.0000 mg | ORAL_TABLET | Freq: Three times a day (TID) | ORAL | Status: DC | PRN

## 2022-03-26 MED ORDER — PHENYLEPHRINE HCL-NACL 20-0.9 MG/250ML-% IV SOLN
INTRAVENOUS | Status: DC | PRN
  Administered 2022-03-26: 50 ug/min via INTRAVENOUS

## 2022-03-26 MED ORDER — GLIMEPIRIDE 4 MG PO TABS
4.0000 mg | ORAL_TABLET | Freq: Every day | ORAL | Status: DC
  Administered 2022-03-26: 4 mg via ORAL
  Filled 2022-03-26 (×2): qty 1

## 2022-03-26 MED ORDER — SULFAMETHOXAZOLE-TRIMETHOPRIM 800-160 MG PO TABS
1.0000 | ORAL_TABLET | Freq: Two times a day (BID) | ORAL | Status: DC
  Administered 2022-03-26 (×2): 1 via ORAL
  Filled 2022-03-26 (×3): qty 1

## 2022-03-26 MED ORDER — SULFADIAZINE 500 MG PO TABS
500.0000 mg | ORAL_TABLET | Freq: Four times a day (QID) | ORAL | Status: DC
  Filled 2022-03-26 (×2): qty 1

## 2022-03-26 MED ORDER — ACETAMINOPHEN 500 MG PO TABS
ORAL_TABLET | ORAL | Status: AC
  Filled 2022-03-26: qty 2

## 2022-03-26 MED ORDER — METHYLPHENIDATE HCL 5 MG PO TABS
2.5000 mg | ORAL_TABLET | Freq: Once | ORAL | Status: DC
  Filled 2022-03-26: qty 1

## 2022-03-26 MED ORDER — TAMSULOSIN HCL 0.4 MG PO CAPS
ORAL_CAPSULE | ORAL | Status: AC
  Filled 2022-03-26: qty 1

## 2022-03-26 MED ORDER — ACETAMINOPHEN 10 MG/ML IV SOLN: 1000.0000 mg | Freq: Once | INTRAVENOUS | Status: DC | PRN

## 2022-03-26 MED ORDER — CEFAZOLIN SODIUM-DEXTROSE 2-4 GM/100ML-% IV SOLN
2.0000 g | INTRAVENOUS | Status: AC
  Administered 2022-03-26: 2 g via INTRAVENOUS

## 2022-03-26 MED ORDER — LIDOCAINE 2% (20 MG/ML) 5 ML SYRINGE
INTRAMUSCULAR | Status: DC | PRN
  Administered 2022-03-26: 80 mg via INTRAVENOUS

## 2022-03-26 MED ORDER — LACTATED RINGERS IV SOLN: INTRAVENOUS | Status: DC

## 2022-03-26 MED ORDER — HYDROCODONE-ACETAMINOPHEN 5-325 MG PO TABS
1.0000 | ORAL_TABLET | ORAL | Status: DC | PRN
  Administered 2022-03-26 – 2022-03-27 (×3): 1 via ORAL
  Administered 2022-03-27: 2 via ORAL

## 2022-03-26 MED ORDER — TAMSULOSIN HCL 0.4 MG PO CAPS
0.4000 mg | ORAL_CAPSULE | Freq: Every day | ORAL | Status: DC
  Administered 2022-03-26: 0.4 mg via ORAL

## 2022-03-26 MED ORDER — ZOLPIDEM TARTRATE 5 MG PO TABS: 5.0000 mg | ORAL_TABLET | Freq: Every evening | ORAL | Status: DC | PRN

## 2022-03-26 SURGICAL SUPPLY — 30 items
BAG DRAIN URO-CYSTO SKYTR STRL (DRAIN) ×1 IMPLANT
BAG DRN RND TRDRP ANRFLXCHMBR (UROLOGICAL SUPPLIES) ×1
BAG DRN UROCATH (DRAIN) ×1
BAG URINE DRAIN 2000ML AR STRL (UROLOGICAL SUPPLIES) ×1 IMPLANT
CATH FOLEY 3WAY 30CC 22FR (CATHETERS) IMPLANT
CATH HEMA 3WAY 30CC 22FR COUDE (CATHETERS) IMPLANT
CLOTH BEACON ORANGE TIMEOUT ST (SAFETY) ×1 IMPLANT
ELECT REM PT RETURN 9FT ADLT (ELECTROSURGICAL) ×1
ELECTRODE REM PT RTRN 9FT ADLT (ELECTROSURGICAL) ×1 IMPLANT
EVACUATOR MICROVAS BLADDER (UROLOGICAL SUPPLIES) IMPLANT
GLOVE BIO SURGEON STRL SZ8 (GLOVE) ×1 IMPLANT
GLOVE BIOGEL PI IND STRL 6.5 (GLOVE) IMPLANT
GLOVE SURG SS PI 8.0 STRL IVOR (GLOVE) IMPLANT
GOWN STRL REUS W/ TWL LRG LVL3 (GOWN DISPOSABLE) IMPLANT
GOWN STRL REUS W/TWL LRG LVL3 (GOWN DISPOSABLE) ×1 IMPLANT
GOWN STRL REUS W/TWL XL LVL3 (GOWN DISPOSABLE) ×1 IMPLANT
HOLDER FOLEY CATH W/STRAP (MISCELLANEOUS) IMPLANT
IV NS IRRIG 3000ML ARTHROMATIC (IV SOLUTION) IMPLANT
KIT TURNOVER CYSTO (KITS) ×1 IMPLANT
LOOP CUT BIPOLAR 24F LRG (ELECTROSURGICAL) ×1 IMPLANT
MANIFOLD NEPTUNE II (INSTRUMENTS) ×1 IMPLANT
NS IRRIG 500ML POUR BTL (IV SOLUTION) IMPLANT
PACK CYSTO (CUSTOM PROCEDURE TRAY) ×1 IMPLANT
PLUG CATH AND CAP STER (CATHETERS) IMPLANT
SYR 30ML LL (SYRINGE) IMPLANT
SYR TOOMEY IRRIG 70ML (MISCELLANEOUS) ×1
SYRINGE TOOMEY IRRIG 70ML (MISCELLANEOUS) IMPLANT
TUBE CONNECTING 12X1/4 (SUCTIONS) ×1 IMPLANT
TUBING UROLOGY SET (TUBING) IMPLANT
WATER STERILE IRR 500ML POUR (IV SOLUTION) IMPLANT

## 2022-03-26 NOTE — Op Note (Signed)
Preoperative diagnosis: Bladder outlet obstruction secondary to BPH  Postoperative diagnosis:  Bladder outlet obstruction secondary to BPH  Procedure:  Cystoscopy Transurethral resection of the prostate  Surgeon: Lillette Boxer. Farran Amsden, M.D.  Anesthesia: General  Complications: None  Drain: Foley catheter  EBL: Minimal  Specimens: Prostate chips  Disposition of specimens: Pathology  Indication: Colton Hampton is a patient with bladder outlet obstruction secondary to benign prostatic hyperplasia. After reviewing the management options for treatment, he elected to proceed with the above surgical procedure(s). We have discussed the potential benefits and risks of the procedure, side effects of the proposed treatment, the likelihood of the patient achieving the goals of the procedure, and any potential problems that might occur during the procedure or recuperation. Informed consent has been obtained.  Findings: He had trilobar hypertrophy.  Significant intravesical median lobe.  Overriding left prostatic lobe.  Several UroLift implants encountered, resected.  Trabeculated bladder.  Following completion of TURP inspection of ureteral orifices bilaterally revealed that they were intact.  Description of procedure:  The patient was identified in the holding area. He received preoperative antibiotics. He was then taken to the operating room. General anesthetic was administered.  The patient was then placed in the dorsal lithotomy position, prepped and draped in the usual sterile fashion. Timeout was then performed.  A resectoscope sheath was placed using the visual obturator.   The bladder was then systematically examined in its entirety. There was no evidence of  tumors, stones, or other mucosal pathology.The resectoscope, loop and telescope were then placed.  The ureteral orifices were identified so as to be avoided during the procedure.  The prostate adenoma was then resected  utilizing loop cautery resection with the bipolar cutting loop.  There was a large intravesical median lobe which was resected first.  The prostate adenoma from the bladder neck back to the verumontanum was resected beginning at the six o'clock position and then extended to include the right and left lobes of the prostate and anterior prostate, respectively. Care was taken not to resect distal to the verumontanum.  Hemostasis was then achieved with the cautery and the bladder was emptied and reinspected with no significant bleeding noted at the end of the procedure.  Resected chips were irrigated from the bladder with the evacuator and sent to pathology.  A 3 way catheter was then placed into the bladder and placed on continuous bladder irrigation.  The patient appeared to tolerate the procedure well and without complications. The patient was able to be awakened and transferred to the recovery unit in satisfactory condition. He tolerated the procedure well.

## 2022-03-26 NOTE — Discharge Instructions (Signed)
Transurethral Resection of the Prostate ° °Care After ° °Refer to this sheet in the next few weeks. These discharge instructions provide you with general information on caring for yourself after you leave the hospital. Your caregiver may also give you specific instructions. Your treatment has been planned according to the most current medical practices available, but unavoidable complications sometimes occur. If you have any problems or questions after discharge, please call your caregiver. ° °HOME CARE INSTRUCTIONS  ° °Medications °· You may receive medicine for pain management. As your level of discomfort decreases, adjustments in your pain medicines may be made.  °· Take all medicines as directed.  °· You may be given a medicine (antibiotic) to kill germs following surgery. Finish all medicines. Let your caregiver know if you have any side effects or problems from the medicine.  °· If you are on aspirin, it would be best not to restart the aspirin until the blood in the urine clears °Hygiene °· You can take a shower after surgery.  °· You should not take a bath while you still have the urethral catheter. °Activity °· You will be encouraged to get out of bed as much as possible and increase your activity level as tolerated.  °· Spend the first week in and around your home. For 3 weeks, avoid the following:  °· Straining.  °· Running.  °· Strenuous work.  °· Walks longer than a few blocks.  °· Riding for extended periods.  °· Sexual relations.  °· Do not lift heavy objects (more than 20 pounds) for at least 1 month. When lifting, use your arms instead of your abdominal muscles.  °· You will be encouraged to walk as tolerated. Do not exert yourself. Increase your activity level slowly. Remember that it is important to keep moving after an operation of any type. This cuts down on the possibility of developing blood clots.  °· Your caregiver will tell you when you can resume driving and light housework. Discuss this  at your first office visit after discharge. °Diet °· No special diet is ordered after a TURP. However, if you are on a special diet for another medical problem, it should be continued.  °· Normal fluid intake is usually recommended.  °· Avoid alcohol and caffeinated drinks for 2 weeks. They irritate the bladder. Decaffeinated drinks are okay.  °· Avoid spicy foods.  °Bladder Function °· For the first 10 days, empty the bladder whenever you feel a definite desire. Do not try to hold the urine for long periods of time.  °· Urinating once or twice a night even after you are healed is not uncommon.  °· You may see some recurrence of blood in the urine after discharge from the hospital. This usually happens within 2 weeks after the procedure.If this occurs, force fluids again as you did in the hospital and reduce your activity.  °Bowel Function °· You may experience some constipation after surgery. This can be minimized by increasing fluids and fiber in your diet. Drink enough water and fluids to keep your urine clear or pale yellow.  °· A stool softener may be prescribed for use at home. Do not strain to move your bowels.  °· If you are requiring increased pain medicine, it is important that you take stool softeners to prevent constipation. This will help to promote proper healing by reducing the need to strain to move your bowels.  °Sexual Activity °· Semen movement in the opposite direction and into the bladder (  retrograde ejaculation) may occur. Since the semen passes into the bladder, cloudy urine can occur the first time you urinate after intercourse. Or, you may not have an ejaculation during erection. Ask your caregiver when you can resume sexual activity. Retrograde ejaculation and reduced semen discharge should not reduce one's pleasure of intercourse.  °Postoperative Visit °· Arrange the date and time of your after surgery visit with your caregiver.  °Return to Work °· After your recovery is complete, you will  be able to return to work and resume all activities. Your caregiver will inform you when you can return to work.  °Foley Catheter Care °A soft, flexible tube (Foley catheter) may have been placed in your bladder to drain urine and fluid. Follow these instructions: °Taking Care of the Catheter °· Keep the area where the catheter leaves your body clean.  °· Attach the catheter to the leg so there is no tension on the catheter.  °· Keep the drainage bag below the level of the bladder, but keep it OFF the floor.  °· Do not take long soaking baths. Your caregiver will give instructions about showering.  °· Wash your hands before touching ANYTHING related to the catheter or bag.  °· Using mild soap and warm water on a washcloth:  °· Clean the area closest to the catheter insertion site using a circular motion around the catheter.  °· Clean the catheter itself by wiping AWAY from the insertion site for several inches down the tube.  °· NEVER wipe upward as this could sweep bacteria up into the urethra (tube in your body that normally drains the bladder) and cause infection.  °· Place a small amount of sterile lubricant at the tip of the penis where the catheter is entering.  °Taking Care of the Drainage Bags °· Two drainage bags may be taken home: a large overnight drainage bag, and a smaller leg bag which fits underneath clothing.  °· It is okay to wear the overnight bag at any time, but NEVER wear the smaller leg bag at night.  °· Keep the drainage bag well below the level of your bladder. This prevents backflow of urine into the bladder and allows the urine to drain freely.  °· Anchor the tubing to your leg to prevent pulling or tension on the catheter. Use tape or a leg strap provided by the hospital.  °· Empty the drainage bag when it is 1/2 to 3/4 full. Wash your hands before and after touching the bag.  °· Periodically check the tubing for kinks to make sure there is no pressure on the tubing which could restrict  the flow of urine.  °Changing the Drainage Bags °· Cleanse both ends of the clean bag with alcohol before changing.  °· Pinch off the rubber catheter to avoid urine spillage during the disconnection.  °· Disconnect the dirty bag and connect the clean one.  °· Empty the dirty bag carefully to avoid a urine spill.  °· Attach the new bag to the leg with tape or a leg strap.  °Cleaning the Drainage Bags °· Whenever a drainage bag is disconnected, it must be cleaned quickly so it is ready for the next use.  °· Wash the bag in warm, soapy water.  °· Rinse the bag thoroughly with warm water.  °· Soak the bag for 30 minutes in a solution of white vinegar and water (1 cup vinegar to 1 quart warm water).  °· Rinse with warm water.  °SEEK MEDICAL   CARE IF:  °· You have chills or night sweats.  °· You are leaking around your catheter or have problems with your catheter. It is not uncommon to have sporadic leakage around your catheter as a result of bladder spasms. If the leakage stops, there is not much need for concern. If you are uncertain, call your caregiver.  °· You develop side effects that you think are coming from your medicines.  °SEEK IMMEDIATE MEDICAL CARE IF:  °· You are suddenly unable to urinate. Check to see if there are any kinks in the drainage tubing that may cause this. If you cannot find any kinks, call your caregiver immediately. This is an emergency.  °· You develop shortness of breath or chest pains.  °· Bleeding persists or clots develop in your urine.  °· You have a fever.  °· You develop pain in your back or over your lower belly (abdomen).  °· You develop pain or swelling in your legs.  °· Any problems you are having get worse rather than better.  °MAKE SURE YOU:  °· Understand these instructions.  °· Will watch your condition.  °· Will get help right away if you are not doing well or get worse.  °Document Released: 03/02/2005 Document Revised: 11/12/2010 Document Reviewed: 10/24/2008 °[employer]®  Patient Information ©2012 [employer].Transurethral Resection of the Prostate °Care After °Refer to this sheet in the next few weeks. These discharge instructions provide you with general information on caring for yourself after you leave the hospital. Your caregiver may also give you specific instructions. Your treatment has been planned according to the most current medical practices available, but unavoidable complications sometimes occur. If you have any problems or questions after discharge, please call your caregiver. °

## 2022-03-26 NOTE — Anesthesia Postprocedure Evaluation (Signed)
Anesthesia Post Note  Patient: Colton Hampton  Procedure(s) Performed: TRANSURETHRAL RESECTION OF THE PROSTATE (TURP) (Prostate)     Patient location during evaluation: PACU Anesthesia Type: General Level of consciousness: awake and alert Pain management: pain level controlled Vital Signs Assessment: post-procedure vital signs reviewed and stable Respiratory status: spontaneous breathing, nonlabored ventilation, respiratory function stable and patient connected to nasal cannula oxygen Cardiovascular status: blood pressure returned to baseline and stable Postop Assessment: no apparent nausea or vomiting Anesthetic complications: no   No notable events documented.  Last Vitals:  Vitals:   03/26/22 1300 03/26/22 1311  BP: 102/63 120/73  Pulse: 88 85  Resp: 14 15  Temp: 36.4 C 36.8 C  SpO2: 94% 98%    Last Pain:  Vitals:   03/26/22 1311  TempSrc:   PainSc: 3                  Danni Shima P Letanya Froh

## 2022-03-26 NOTE — Anesthesia Procedure Notes (Addendum)
Procedure Name: LMA Insertion Date/Time: 03/26/2022 9:28 AM  Performed by: Mechele Claude, CRNAPre-anesthesia Checklist: Patient identified, Emergency Drugs available, Suction available and Patient being monitored Patient Re-evaluated:Patient Re-evaluated prior to induction Oxygen Delivery Method: Circle system utilized Preoxygenation: Pre-oxygenation with 100% oxygen Induction Type: IV induction Ventilation: Mask ventilation without difficulty LMA: LMA inserted LMA Size: 5.0 Number of attempts: 1 Airway Equipment and Method: Bite block Placement Confirmation: positive ETCO2 Tube secured with: Tape Dental Injury: Teeth and Oropharynx as per pre-operative assessment

## 2022-03-26 NOTE — Transfer of Care (Signed)
Immediate Anesthesia Transfer of Care Note  Patient: Colton Hampton  Procedure(s) Performed: Procedure(s) (LRB): TRANSURETHRAL RESECTION OF THE PROSTATE (TURP) (N/A)  Patient Location: PACU  Anesthesia Type: General  Level of Consciousness: awake, alert  and oriented  Airway & Oxygen Therapy: Patient Spontanous Breathing and Patient connected to face mask oxygen  Post-op Assessment: Report given to PACU RN and Post -op Vital signs reviewed and stable  Post vital signs: Reviewed and stable  Complications: No apparent anesthesia complications Last Vitals:  Vitals Value Taken Time  BP    Temp 36.7 C 03/26/22 1141  Pulse 89 03/26/22 1148  Resp 18 03/26/22 1148  SpO2 97 % 03/26/22 1148  Vitals shown include unvalidated device data.  Last Pain:  Vitals:   03/26/22 0736  TempSrc: Oral  PainSc: 0-No pain      Patients Stated Pain Goal: 3 (94/[id number])  Complications: No notable events documented.

## 2022-03-26 NOTE — Interval H&P Note (Signed)
History and Physical Interval Note:  03/26/2022 9:14 AM  Colton Hampton  has presented today for surgery, with the diagnosis of BENIGN PROSTATIC HYPERPLASIA.  The various methods of treatment have been discussed with the patient and family. After consideration of risks, benefits and other options for treatment, the patient has consented to  Procedure(s): TRANSURETHRAL RESECTION OF THE PROSTATE (TURP) (N/A) as a surgical intervention.  The patient's history has been reviewed, patient examined, no change in status, stable for surgery.  I have reviewed the patient's chart and labs.  Questions were answered to the patient's satisfaction.     Lillette Boxer Rashed Edler

## 2022-03-27 DIAGNOSIS — N401 Enlarged prostate with lower urinary tract symptoms: Secondary | ICD-10-CM | POA: Diagnosis not present

## 2022-03-27 LAB — GLUCOSE, CAPILLARY: Glucose-Capillary: 125 mg/dL — ABNORMAL HIGH (ref 70–99)

## 2022-03-27 LAB — SURGICAL PATHOLOGY

## 2022-03-27 MED ORDER — HYDROCODONE-ACETAMINOPHEN 5-325 MG PO TABS
ORAL_TABLET | ORAL | Status: AC
  Filled 2022-03-27: qty 2

## 2022-03-27 MED ORDER — HYDROCODONE-ACETAMINOPHEN 5-325 MG PO TABS
ORAL_TABLET | ORAL | Status: AC
  Filled 2022-03-27: qty 1

## 2022-03-30 ENCOUNTER — Encounter (HOSPITAL_BASED_OUTPATIENT_CLINIC_OR_DEPARTMENT_OTHER): Payer: Self-pay | Admitting: Urology

## 2022-03-31 NOTE — Discharge Summary (Signed)
Date of admission: 03/26/2022  Date of discharge: 1.12.2024  Admission diagnosis: BPH    Discharge diagnosis: BPH  Secondary diagnoses: None  History and Physical: For full details, please see admission history and physical. Briefly, Colton Hampton is a 67 y.o. male with BPH.   Hospital Course:  The patient underwent transurethral resection of prostate on January 11. Continuous bladder irrigation was initiated post-operatively. He tolerated the procedure well and was transferred to the floor after receiving routine post-operative care. His diet was gradually advanced, and his pain was controlled with oral analgesics.  By POD1, his urine had cleared on slow drip CBI, so CBI was stopped. His hematuria remained mild with CBI stopped and after ambulation. CBI was disconnected, and the 3rd port of his foley catheter was plugged.   By later on POD1, he was tolerating a regular diet, ambulating, having good urine output with mild hematuria via foley catheter, and having pain well-controlled with oral analgesics. Thus, he was deemed appropriate for discharge home. His foley catheter was continued on discharge.  Laboratory values:  No results for input(s): "HGB", "HCT" in the last 72 hours. No results for input(s): "CREATININE" in the last 72 hours. Physical Exam:  General: Alert and oriented CV: Regular rate Lungs: NWOB on RA Abdomen: Soft, nondistended, nontender GU: Foley in place draining clear pink urine without clots  Extremities: Warm and well-perfused   Disposition: Home  Discharge instruction: The patient was instructed to be ambulatory but told to refrain from heavy lifting, strenuous activity, or driving while on narcotics.   Discharge medications:  Allergies as of 03/27/2022   No Known Allergies      Medication List     STOP taking these medications    aspirin EC 81 MG tablet   finasteride 5 MG tablet Commonly known as: PROSCAR       TAKE these medications     acetaminophen 325 MG tablet Commonly known as: TYLENOL Take 650 mg by mouth every 6 (six) hours as needed. Patient has been taking 3-4 every 4 hours   atenolol 25 MG tablet Commonly known as: TENORMIN Take 25 mg by mouth daily.   Fish Oil 1000 MG Caps Take 1,000 mg by mouth daily with supper.   glimepiride 4 MG tablet Commonly known as: AMARYL Take 4 mg by mouth daily with breakfast.   Invokana 100 MG Tabs tablet Generic drug: canagliflozin Take 100 mg by mouth daily before breakfast.   Jardiance 25 MG Tabs tablet Generic drug: empagliflozin Take 25 mg by mouth daily.   lisinopril 10 MG tablet Commonly known as: ZESTRIL Take 10 mg by mouth daily.   metFORMIN 1000 MG tablet Commonly known as: GLUCOPHAGE Take 1,000 mg by mouth 2 (two) times daily with a meal.   methylphenidate 10 MG tablet Commonly known as: RITALIN Take 5 mg by mouth See admin instructions. Take 1/2 tablet (5 mg) every morning on work days, may take an additional 1/2 tablet later in the day if needed to focus   multivitamin with minerals Tabs tablet Take 1 tablet by mouth daily with supper.   simvastatin 40 MG tablet Commonly known as: ZOCOR Take 40 mg by mouth every evening.   sulfaDIAZINE 500 MG tablet Take 500 mg by mouth 4 (four) times daily.   tamsulosin 0.4 MG Caps capsule Commonly known as: FLOMAX Take 0.4 mg by mouth.          Followup:   Follow-up Information     Karen Kays,  NP Follow up.   Specialty: Nurse Practitioner Why: 04/02/2022 @ 9:30 AM Contact information: [address] [place] [address] [phone]
